# Patient Record
Sex: Female | Born: 1980 | Race: Black or African American | Hispanic: No | Marital: Single | Smoking: Current every day smoker
Health system: Southern US, Community
[De-identification: ages and names within clinical notes are randomized; demographics above are authoritative.]

## PROBLEM LIST (undated history)

## (undated) DIAGNOSIS — F319 Bipolar disorder, unspecified: Secondary | ICD-10-CM

---

## 1999-03-18 ENCOUNTER — Emergency Department (HOSPITAL_COMMUNITY): Admission: EM | Admit: 1999-03-18 | Discharge: 1999-03-18 | Payer: Self-pay | Admitting: Emergency Medicine

## 2011-11-30 ENCOUNTER — Emergency Department: Payer: Self-pay | Admitting: Emergency Medicine

## 2012-10-11 ENCOUNTER — Ambulatory Visit: Payer: Self-pay | Admitting: Advanced Practice Midwife

## 2013-01-21 ENCOUNTER — Encounter: Payer: Self-pay | Admitting: Obstetrics and Gynecology

## 2013-01-24 ENCOUNTER — Encounter: Payer: Self-pay | Admitting: Obstetrics and Gynecology

## 2013-01-28 ENCOUNTER — Encounter: Payer: Self-pay | Admitting: Maternal & Fetal Medicine

## 2013-01-29 ENCOUNTER — Encounter: Payer: Self-pay | Admitting: Pediatrics

## 2013-01-31 ENCOUNTER — Observation Stay: Payer: Self-pay

## 2013-02-04 ENCOUNTER — Encounter: Payer: Self-pay | Admitting: Maternal and Fetal Medicine

## 2013-02-11 ENCOUNTER — Encounter: Payer: Self-pay | Admitting: Obstetrics and Gynecology

## 2013-02-14 ENCOUNTER — Encounter: Payer: Self-pay | Admitting: Obstetrics & Gynecology

## 2013-02-18 ENCOUNTER — Encounter: Payer: Self-pay | Admitting: Maternal and Fetal Medicine

## 2013-02-21 ENCOUNTER — Encounter: Payer: Self-pay | Admitting: Obstetrics and Gynecology

## 2013-02-28 ENCOUNTER — Ambulatory Visit: Payer: Self-pay | Admitting: Obstetrics and Gynecology

## 2013-02-28 ENCOUNTER — Encounter: Payer: Self-pay | Admitting: Obstetrics and Gynecology

## 2013-03-04 ENCOUNTER — Encounter: Payer: Self-pay | Admitting: Obstetrics and Gynecology

## 2013-03-05 ENCOUNTER — Ambulatory Visit: Payer: Self-pay | Admitting: Obstetrics and Gynecology

## 2013-03-06 ENCOUNTER — Ambulatory Visit: Payer: Self-pay | Admitting: Obstetrics and Gynecology

## 2013-03-08 ENCOUNTER — Observation Stay: Payer: Self-pay

## 2013-03-08 LAB — CBC WITH DIFFERENTIAL/PLATELET
Basophil #: 0 10*3/uL (ref 0.0–0.1)
Basophil %: 0.2 %
Eosinophil #: 0 10*3/uL (ref 0.0–0.7)
Eosinophil %: 0.1 %
HCT: 36.1 % (ref 35.0–47.0)
Lymphocyte #: 2.9 10*3/uL (ref 1.0–3.6)
Lymphocyte %: 21.8 %
MCHC: 33.5 g/dL (ref 32.0–36.0)
Monocyte %: 10.9 %
Neutrophil #: 8.8 10*3/uL — ABNORMAL HIGH (ref 1.4–6.5)
Neutrophil %: 67 %
RBC: 3.96 10*6/uL (ref 3.80–5.20)

## 2013-03-08 LAB — BASIC METABOLIC PANEL
Anion Gap: 8 (ref 7–16)
BUN: 16 mg/dL (ref 7–18)
Calcium, Total: 8.9 mg/dL (ref 8.5–10.1)
Chloride: 103 mmol/L (ref 98–107)
Co2: 22 mmol/L (ref 21–32)
Creatinine: 0.95 mg/dL (ref 0.60–1.30)
Glucose: 217 mg/dL — ABNORMAL HIGH (ref 65–99)
Osmolality: 274 (ref 275–301)
Sodium: 133 mmol/L — ABNORMAL LOW (ref 136–145)

## 2013-03-09 LAB — COMPREHENSIVE METABOLIC PANEL
Albumin: 2.6 g/dL — ABNORMAL LOW (ref 3.4–5.0)
Alkaline Phosphatase: 154 U/L — ABNORMAL HIGH (ref 50–136)
Anion Gap: 7 (ref 7–16)
BUN: 14 mg/dL (ref 7–18)
Calcium, Total: 8.8 mg/dL (ref 8.5–10.1)
Co2: 21 mmol/L (ref 21–32)
Creatinine: 0.9 mg/dL (ref 0.60–1.30)
EGFR (African American): >60
EGFR (Non-African Amer.): >60
Glucose: 149 mg/dL — ABNORMAL HIGH (ref 65–99)
Potassium: 3.8 mmol/L (ref 3.5–5.1)
SGPT (ALT): 18 U/L (ref 12–78)
Sodium: 131 mmol/L — ABNORMAL LOW (ref 136–145)
Total Protein: 7.6 g/dL (ref 6.4–8.2)

## 2013-03-09 LAB — RAPID URINE DRUG SCREEN, HOSP PERFORMED
Amphetamines, Ur Screen: NEGATIVE (ref ?–1000)
Barbiturates, Ur Screen: NEGATIVE (ref ?–200)
Cannabinoid 50 Ng, Ur ~~LOC~~: NEGATIVE (ref ?–50)
Opiate, Ur Screen: NEGATIVE (ref ?–300)

## 2013-03-09 LAB — HEPATIC FUNCTION PANEL A (ARMC): Bilirubin, Direct: <0.1 mg/dL (ref 0.00–0.20)

## 2013-03-09 LAB — RENAL FUNCTION PANEL: Phosphorus: 2.8 mg/dL (ref 2.5–4.9)

## 2013-03-10 LAB — COMPREHENSIVE METABOLIC PANEL
Chloride: 108 mmol/L — ABNORMAL HIGH (ref 98–107)
EGFR (African American): >60
EGFR (Non-African Amer.): >60
Osmolality: 268 (ref 275–301)
Potassium: 3.9 mmol/L (ref 3.5–5.1)
Sodium: 134 mmol/L — ABNORMAL LOW (ref 136–145)

## 2013-03-10 LAB — HEPATIC FUNCTION PANEL A (ARMC)
Albumin: 2.3 g/dL — ABNORMAL LOW (ref 3.4–5.0)
Alkaline Phosphatase: 136 U/L (ref 50–136)
Bilirubin, Direct: <0.1 mg/dL (ref 0.00–0.20)
Bilirubin,Total: 0.2 mg/dL (ref 0.2–1.0)
SGOT(AST): 16 U/L (ref 15–37)
SGPT (ALT): 16 U/L (ref 12–78)
Total Protein: 6.7 g/dL (ref 6.4–8.2)

## 2013-03-18 ENCOUNTER — Inpatient Hospital Stay: Payer: Self-pay | Admitting: Obstetrics and Gynecology

## 2013-03-18 ENCOUNTER — Encounter: Payer: Self-pay | Admitting: Maternal and Fetal Medicine

## 2013-03-18 LAB — CBC WITH DIFFERENTIAL/PLATELET
Basophil %: 0.3 %
Eosinophil #: 0 10*3/uL (ref 0.0–0.7)
Eosinophil %: 0.2 %
HCT: 37.2 % (ref 35.0–47.0)
HGB: 12.7 g/dL (ref 12.0–16.0)
Lymphocyte %: 22.9 %
MCH: 31.2 pg (ref 26.0–34.0)
MCHC: 34.2 g/dL (ref 32.0–36.0)
Monocyte #: 0.9 x10 3/mm (ref 0.2–0.9)
Monocyte %: 8.5 %
Neutrophil %: 68.1 %
RBC: 4.06 10*6/uL (ref 3.80–5.20)
RDW: 15.3 % — ABNORMAL HIGH (ref 11.5–14.5)
WBC: 10 10*3/uL (ref 3.6–11.0)

## 2013-03-20 LAB — PATHOLOGY REPORT

## 2014-08-22 IMAGING — US US OB FOLLOW-UP
1 series · 14 of 28 positions shown · non-contrast
Comparison: none

[Series 1: us ob follow-up · 0.25mm/px · 14 of 63 slices shown]
[im 3/63]
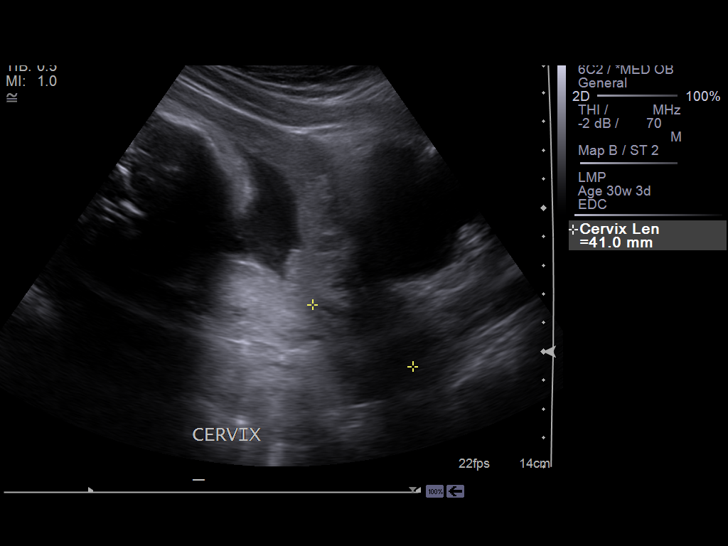
[im 7/63]
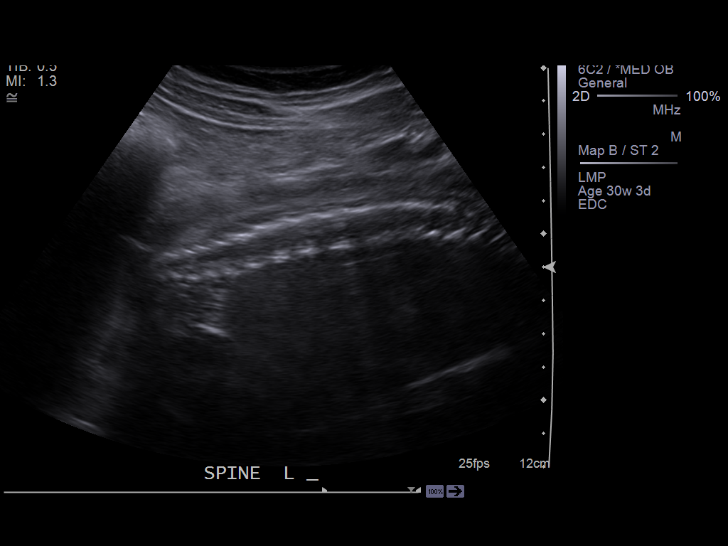
[im 12/63]
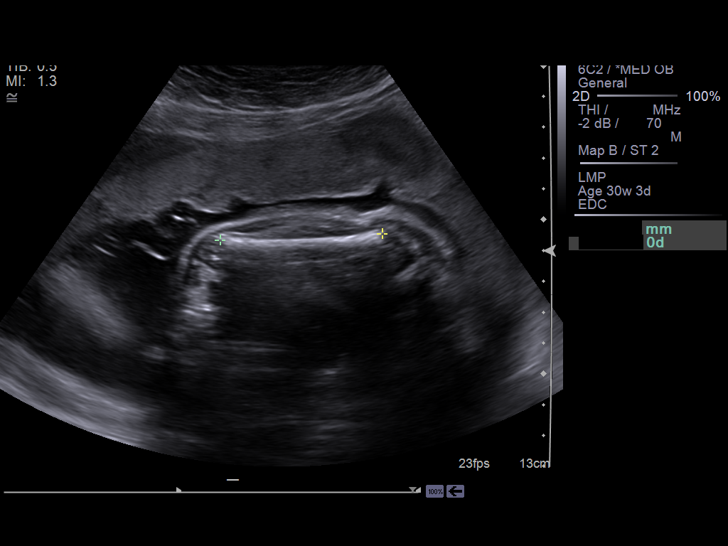
[im 17/63]
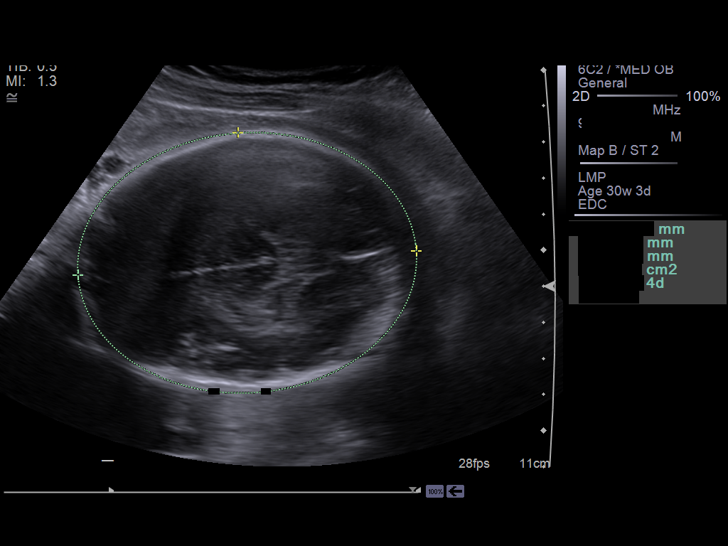
[im 21/63]
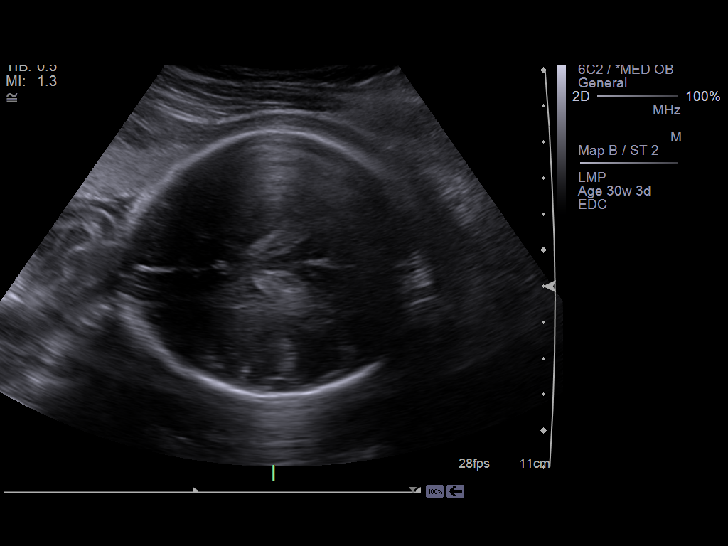
[im 26/63]
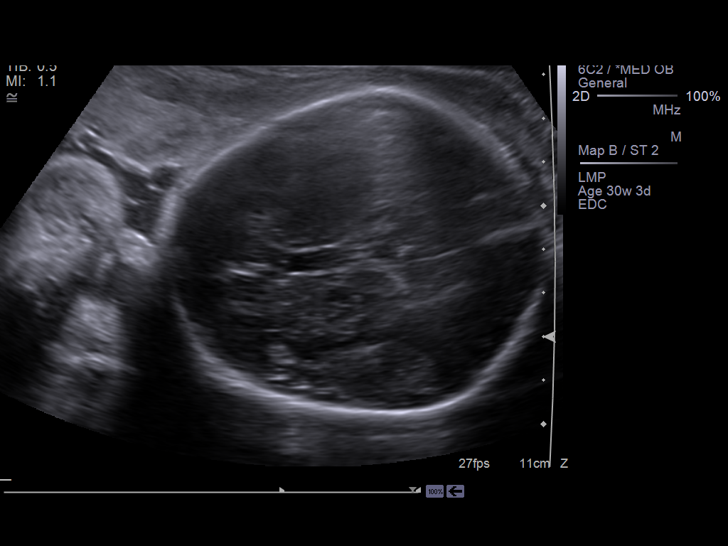
[im 30/63]
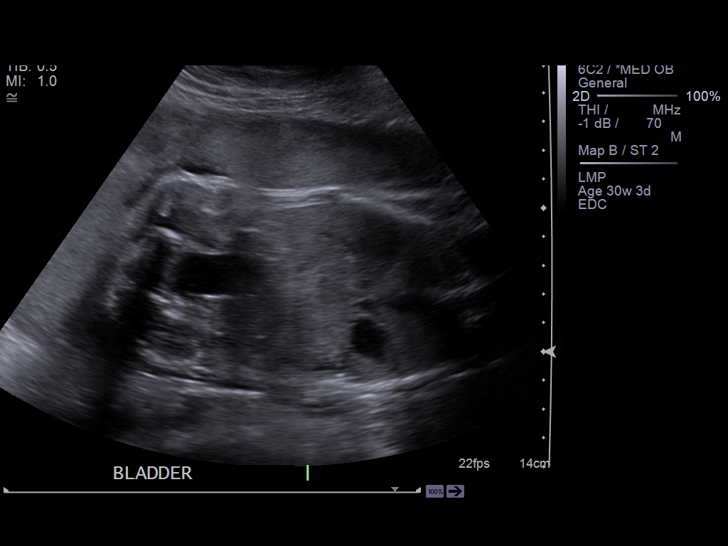
[im 35/63]
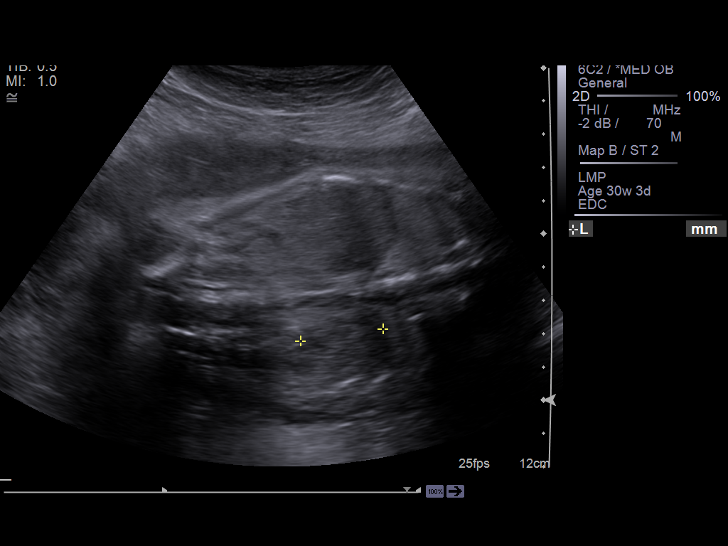
[im 40/63]
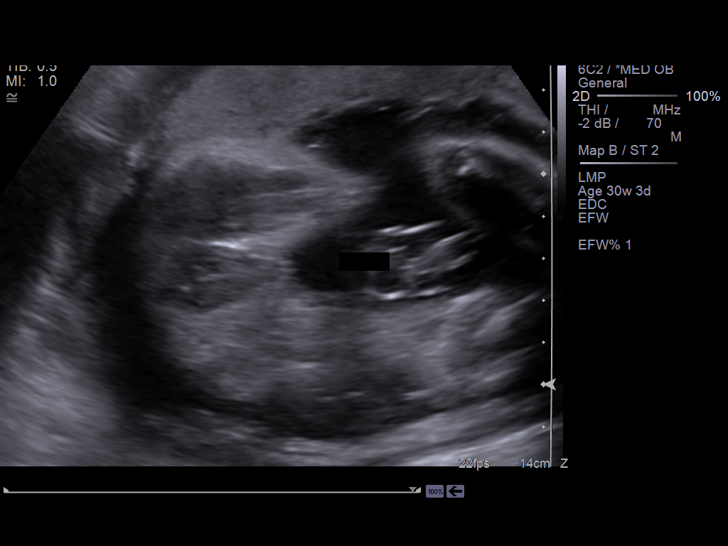
[im 44/63]
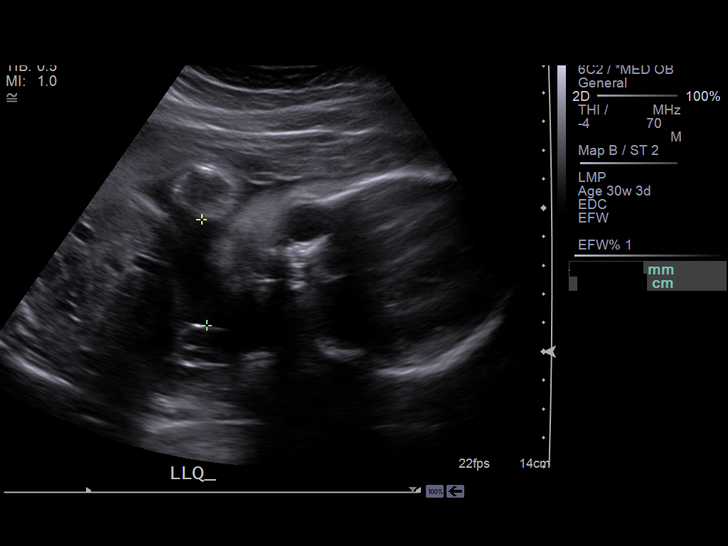
[im 49/63]
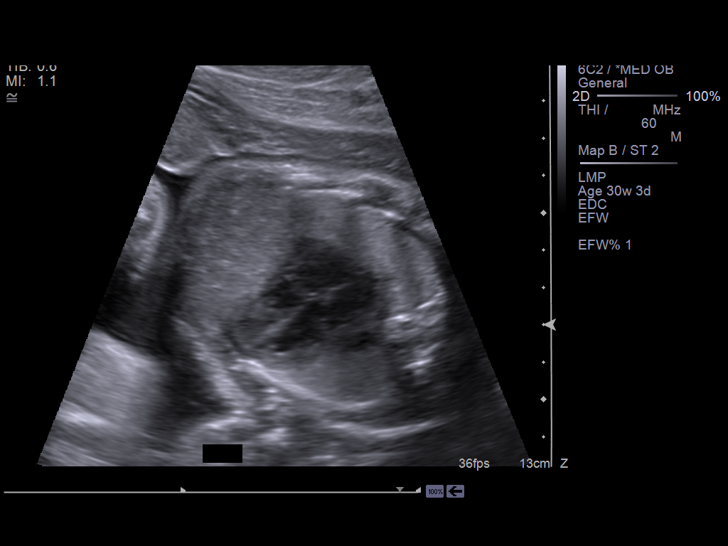
[im 53/63]
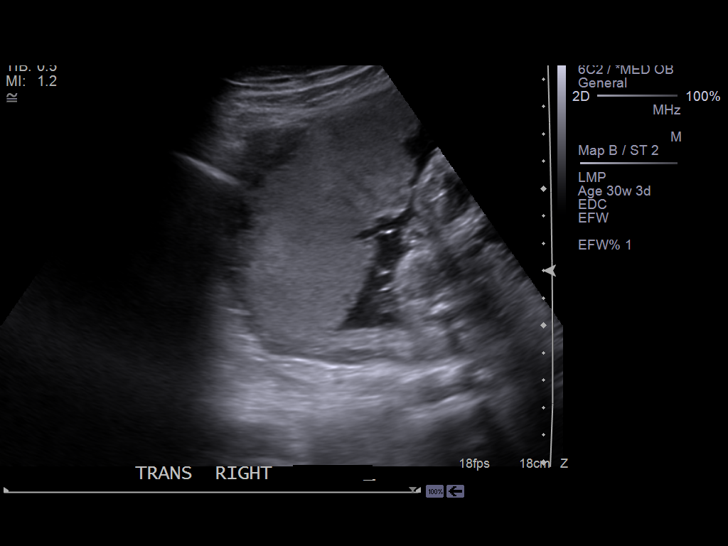
[im 58/63]
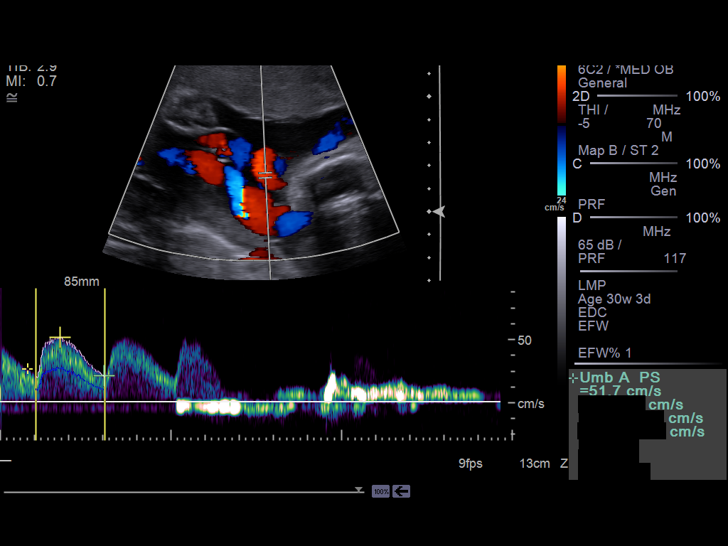
[im 63/63]
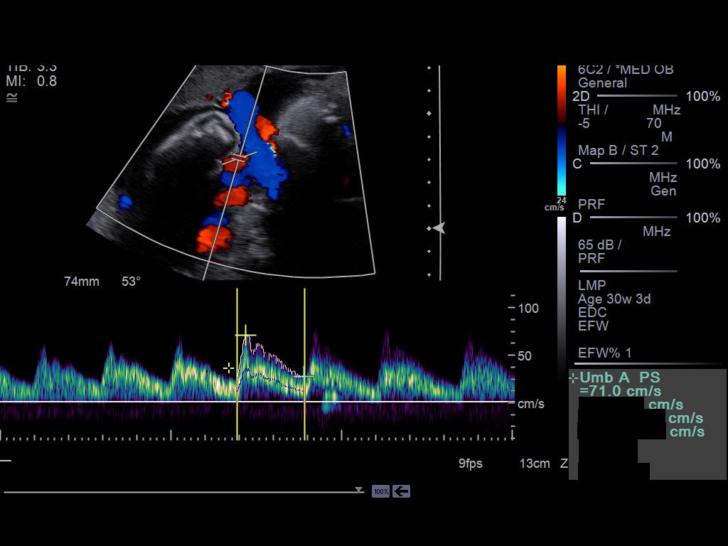

[14 of 28 positions shown; findings below may reference images not displayed]

IMAGES IMPORTED FROM THE [employer] WORKFLOW SYSTEM
NO DICTATION FOR STUDY

## 2014-09-26 IMAGING — US ULTRAOUND OB LIMITED - NRPT [facility]
1 series · 14 of 16 positions shown · non-contrast
Comparison: none

[Series 1: ultraound ob limited - nrpt [facility] · 0.28mm/px · 14 of 16 slices shown]
[im 1/16]
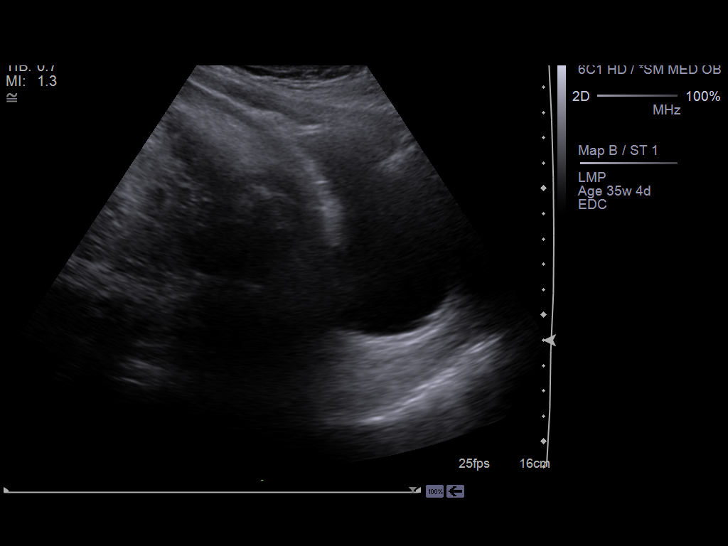
[im 2/16]
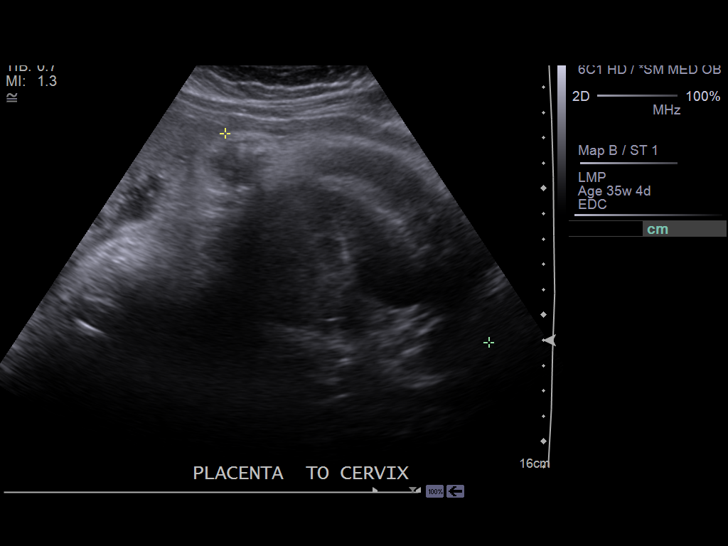
[im 3/16]
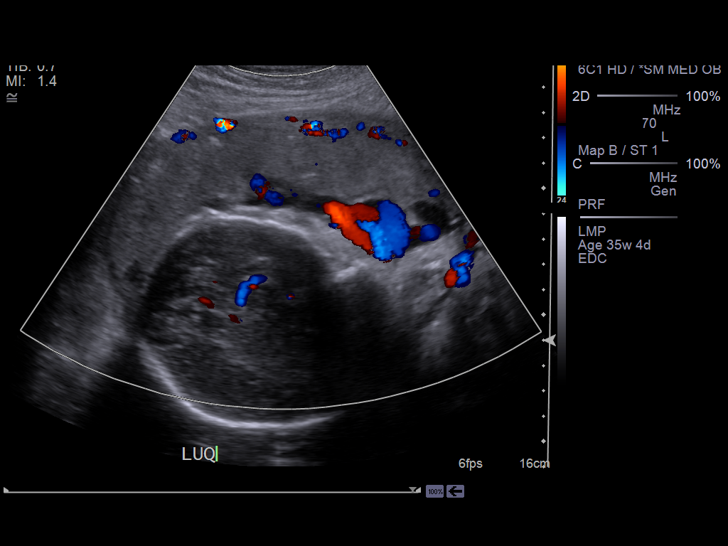
[im 5/16]
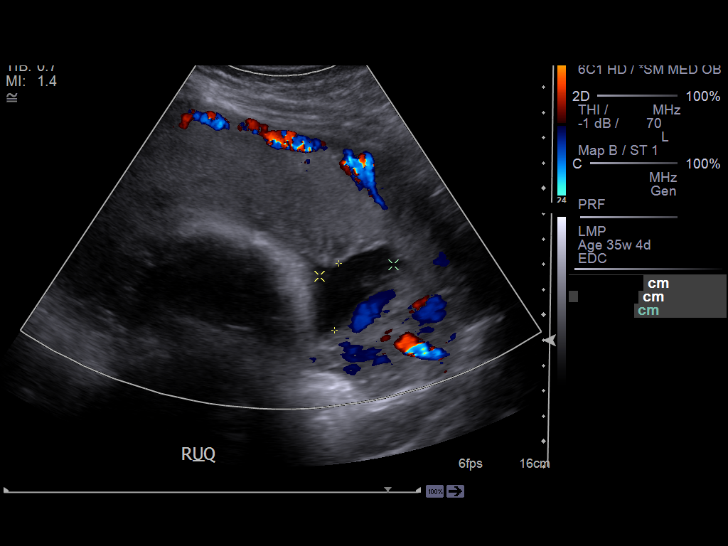
[im 6/16]
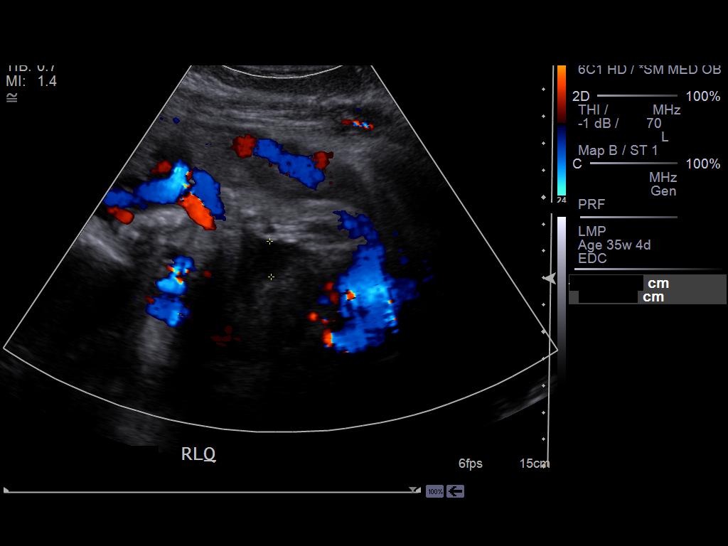
[im 7/16]
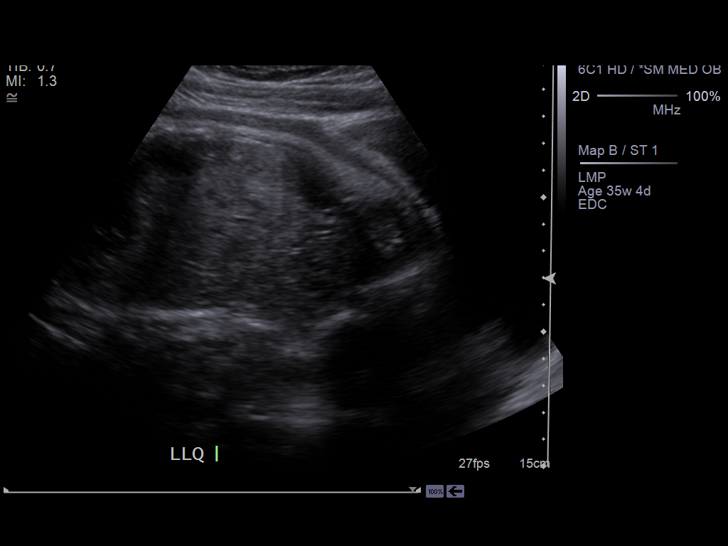
[im 8/16]
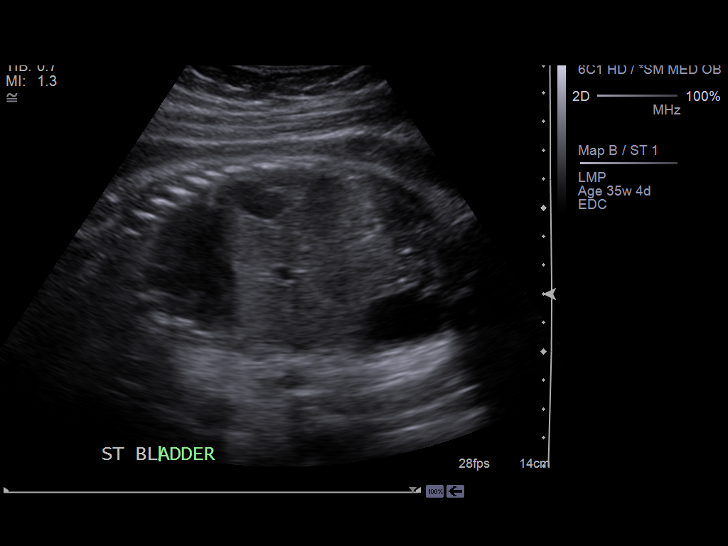
[im 9/16]
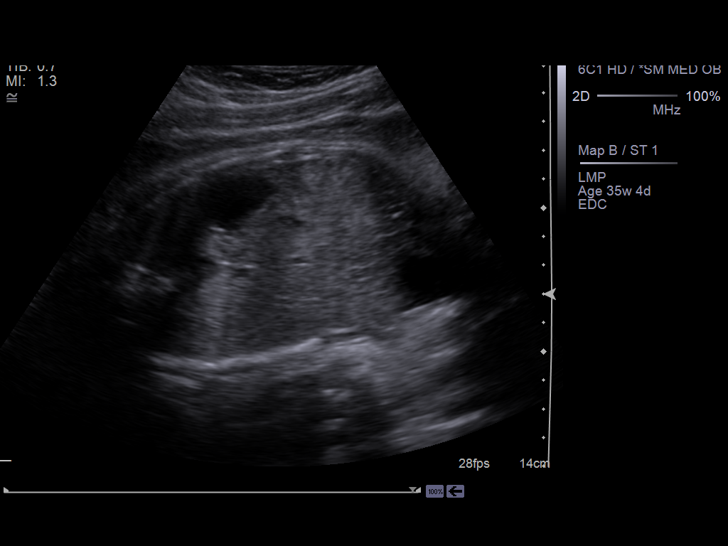
[im 10/16]
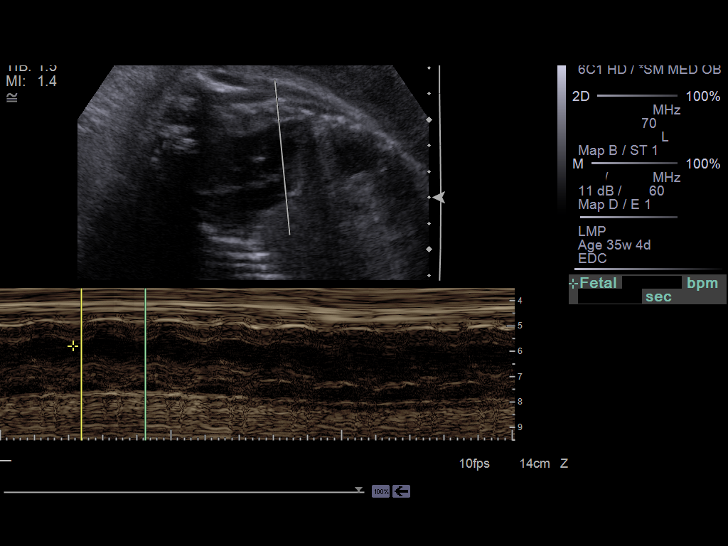
[im 11/16]
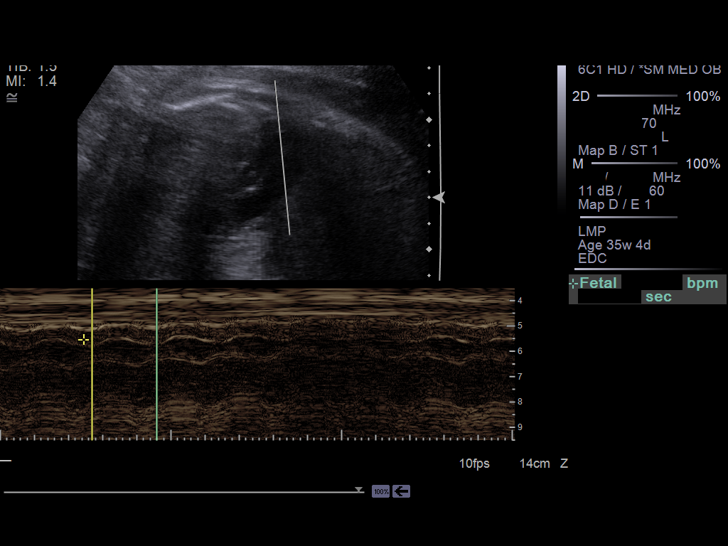
[im 13/16]
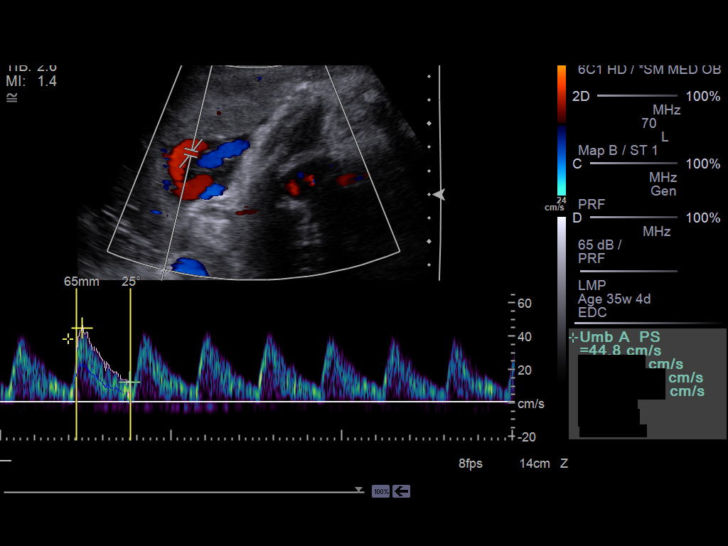
[im 14/16]
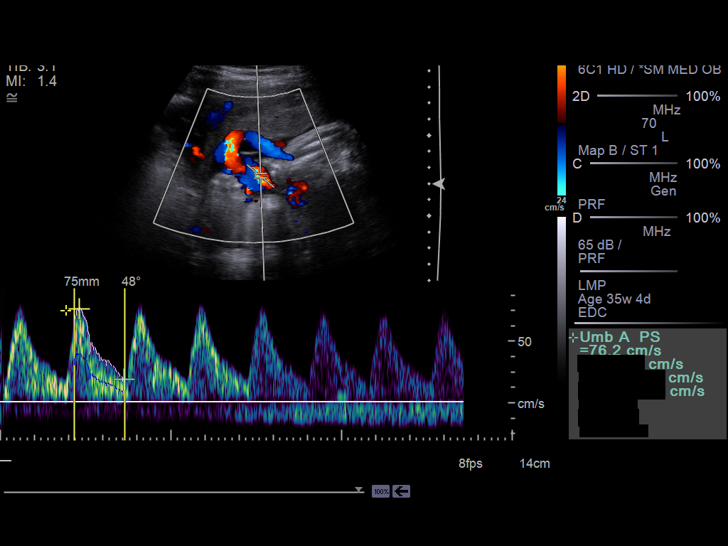
[im 15/16]
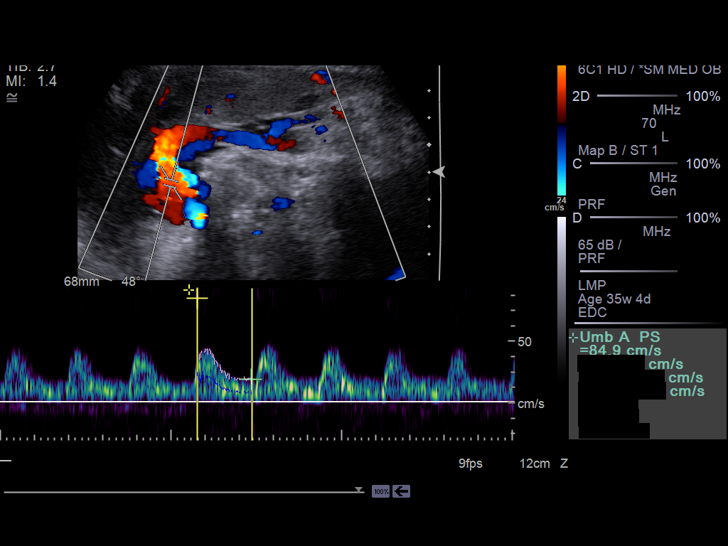
[im 16/16]
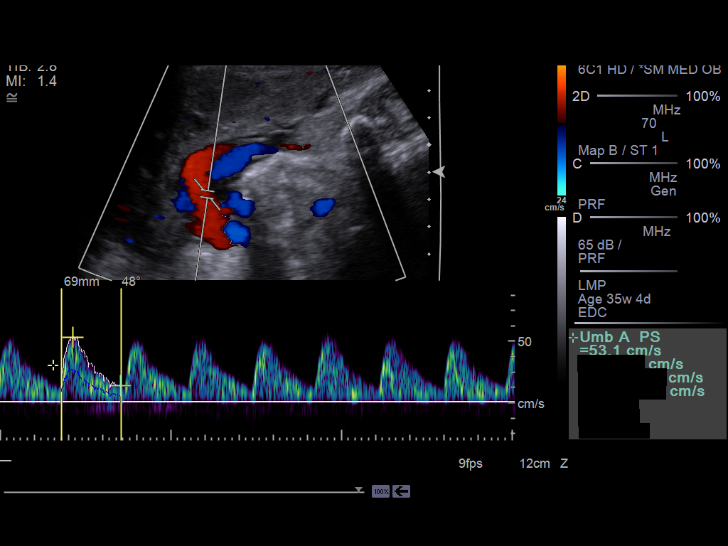

[14 of 16 positions shown; findings below may reference images not displayed]

IMAGES IMPORTED FROM THE SYNGO WORKFLOW SYSTEM
NO DICTATION FOR STUDY

## 2015-04-13 ENCOUNTER — Ambulatory Visit
Admit: 2015-04-13 | Disposition: A | Discharge status: Home / Self Care | Payer: Self-pay | Attending: Family Medicine | Admitting: Family Medicine

## 2015-04-17 NOTE — Op Note (Signed)
PATIENT NAME:  Marcia Jensen, Marcia Jensen MR#:  [id number] DATE OF BIRTH:  [birth year]  DATE OF PROCEDURE:  03/18/2013  PREOPERATIVE DIAGNOSES:  1.  Thirty-five plus week gestation.  2.  Intrauterine growth retardation.  3.  Oligohydramnios.  4.  Worsening S/D ratio by Doppler of the umbilical cord.  5.  Nonreassuring fetal tracing.   POSTOPERATIVE DIAGNOSES: 1.  Thirty-five plus week gestation.  2.  Intrauterine growth retardation.  3.  Oligohydramnios.  4.  Worsening S/D ratio by Doppler of the umbilical cord.  5.  Nonreassuring fetal tracing.   PROCEDURE: Primary low transverse cesarean section.   SURGEON: Ricky L. Logan BoresEvans, MD   ASSISTANT: For scrub Morrie Sheldon([place])   ANESTHESIA: Spinal.   FINDINGS: Grossly normal uterus, small infant, weight pending. Apgars 7 and 9. Grossly normal placenta. Good hemostasis, cosmesis.   ESTIMATED BLOOD LOSS: 750.   COMPLICATIONS: None.   DRAINS: Foley.   SPECIMENS: Cord blood for gas; placenta sent for pathology.   PROCEDURE IN DETAIL: I have been following the patient for the past few weeks due to intrauterine growth restriction and low fluid. [facility]. She was evaluated at MFM today and was found to have a Doppler of the cord of 3.8, which is borderline abnormal. Fluid was also 4 cm, qualifying as oligohydramnios. We recommended delivery. The patient was presented to labor and delivery where fetal monitoring showed recurrent moderate variable decelerations, in the absence of contractions,   I discussed with the patient my concerns, especially given the fact that she has never had a baby before and she is 35 weeks; typically it takes 1 to 2 days to induce labor and if the infant was having moderate decelerations at rest, I was concerned about the stability to withstand a prolonged labor. I recommend a cesarean section, discussed with patient and family, and they stated understanding and consented. Consent was  signed.   The patient was taken to the operating room where a spinal was placed. She was placed in the supine position. Foley catheter was inserted. She was prepped and draped in the usual sterile  fashion.  After assuring adequate anesthesia, a #10 blade was used to create a transverse incision, and a primary low transverse cesarean section was carried out in the usual fashion. The infant was in frank breech presentation, was delivered thusly. The uterine cavity was curettaged and was closed with a running interlocking 0 chromic. Figure-of-eights were placed approximately one-third of the way across in the left and at the right apex for hemostasis. The area seemed to be hemostatic. The uterus was returned to the abdominal cavity, copiously irrigated. The areas again seemed to be hemostatic. The rectus muscle was checked for hemostasis and seen to be excellent. Fascia was closed left-to-right with 0 Vicryl, the subQ was made hemostatic with cautery,  and the skin was closed with Insorb absorbable clips. A sterile dressing was applied.   Two grams of Ancef were given IV preoperatively. The patient tolerated the procedure well. I anticipate a routine postoperative course.    ____________________________ Reatha Harpsicky L. Logan BoresEvans, MD rle:dm D: 03/18/2013 15:11:00 ET T: 03/18/2013 15:47:49 ET JOB#: [id number]  cc: Ricky L. Logan BoresEvans, MD, <Dictator> Augustina MoodICK L Lynsey Ange MD ELECTRONICALLY SIGNED 03/19/2013 8:52

## 2015-04-17 NOTE — Discharge Summary (Signed)
PATIENT NAME:  Marcia Jensen, Cadie N MR#:  [id number] DATE OF BIRTH:  March 21, 1981  DATE OF ADMISSION:  03/18/2013 DATE OF DISCHARGE:  03/21/2013  ADMITTING DIAGNOSES: Intrauterine fetal demise at 27 weeks with presumed placental abruption.   DISCHARGE DIAGNOSES: Intrauterine fetal demise at 27 weeks with presumed placental abruption.   PROCEDURE: Low vertical hysterotomy.   CONSULTATIONS: Anesthesia and Neonatology. Please see operative note.   HOSPITAL COURSE: Postpartum was uncomplicated. She was afebrile throughout. Mental status appeared appropriate, appeared to be grieving well. She was discharged home with routine prescriptions, precautions, and followup.  Will discuss with her at followup my recommendation to not have more children due to operative difficulty and poor obstetrical history.     ____________________________ Reatha Harpsicky L. Logan BoresEvans, MD rle:es D: 03/25/2013 12:12:02 ET T: 03/25/2013 12:50:08 ET JOB#: [id number]  cc: Ricky L. Logan BoresEvans, MD, <Dictator>

## 2015-04-17 NOTE — Discharge Summary (Signed)
PATIENT NAME:  Marcia Jensen, Loza N MR#:  [id number] DATE OF BIRTH:  12-18-1981  DATE OF ADMISSION:  03/18/2013 DATE OF DISCHARGE:  03/21/2013  ADMISSION DIAGNOSIS:  1. A 35+ week gestation. 2. Intrauterine growth retardation. 3. Oligohydramnios.  4. Worsening S/D ratio by Doppler of the umbilical cord.  5. Nonreassuring fetal tracing.   DISCHARGE DIAGNOSES: 1. A 35+ week gestation. 2. Intrauterine growth retardation. 3. Oligohydramnios.  4. Worsening S/D ratio by Doppler of the umbilical cord.  5. Nonreassuring fetal tracing.   PROCEDURE: Primary low transverse cesarean section. Please see operative note.  HOSPITAL COURSE: Postoperatively, the patient did well. Hospital course was uncomplicated, and she was discharged home with routine prescriptions, precautions, and followup.    ____________________________ Reatha Harpsicky L. Logan BoresEvans, MD rle:es D: 03/25/2013 12:13:00 ET T: 03/25/2013 12:52:58 ET JOB#: [id number]  cc: Ricky L. Logan BoresEvans, MD, <Dictator> Augustina MoodICK L Saul Dorsi MD ELECTRONICALLY SIGNED 03/26/2013 9:55

## 2015-05-05 NOTE — H&P (Signed)
L&D Evaluation:  History:  HPI 34 y/o G1 with EDC 04/23/13 (U/S; unk LNP) Pegnancy complicated ny IUGR, Oligohydramnios and now rising S:D by doppler Sent for induction   Presents with for induction   Patient's Medical History No Chronic Illness   Patient's Surgical History none   Medications Pre Natal Vitamins   Allergies NKDA   Social History none   Family History Non-Contributory   ROS:  ROS All systems were reviewed.  HEENT, CNS, GI, GU, Respiratory, CV, Renal and Musculoskeletal systems were found to be normal.   Exam:  Vital Signs stable   Mental Status clear   Chest clear   Heart normal sinus rhythm   Abdomen gravid, non-tender   Estimated Fetal Weight Small for gestational age   Reflexes 1+   Mebranes Intact   FHT recurrent variable decels   FHT Description Variable decelerations   Ucx absent   Impression:  Impression IUGR, Oligo, rising S:D on doppler   Plan:  Comments Planned induction, but recurrent mod variable decels on NST makes fetral tolerance of (likely) prolonged induction unlikely Discussed situation with pt and family; I recommend urgent C/S After discussion of C/S with pt (technique, recovery, reasoning for procedure, risks of bleed/organ injury/repeat surgery/extension of surgery/...) pt gives consent Pt declines BTL Neonatology aware   Electronic Signatures: Margaretha GlassingEvans, Ricky L (MD)  (Signed 24-Mar-14 14:06)  Authored: L&D Evaluation   Last Updated: 24-Mar-14 14:06 by Margaretha GlassingEvans, Ricky L (MD)

## 2015-05-05 NOTE — H&P (Signed)
L&D Evaluation:  History:  HPI 34 yo G1P0 with lMP of 04/06/13 & EDD of 04/18/13 with [facility] as pt had a drop in her AFI from 14 to 8.41 in 1 week, single umbilical artery, IUGR and fundal ht of 28 cm at 34 weeks. NST reactive in office with 2 accels 15 x 15 bpm. Pt has no job, no car (repossed), no family who she can depend on to bring her to the office for NST's. Pt already failed to fu for BMZ 2nd dose although she got it much later. No ROM,VB,UC's or decreased FM. Pt also lost 2 pounds in the last 2 weeks. Poor historian. Pt told RN that her mom had a car but, told me she did not. On questioning pt, she advised myself and RN that her mom does not have car.   Presents with ?LOF   Patient's Medical History Schizo,Bi-polar, Enlarged thyroid, Hx MJ,Crack,   Medications Iron   Allergies NKDA   Social History drugs  used MJ,crack but UDS neg   Family History Non-Contributory   ROS:  ROS All systems were reviewed.  HEENT, CNS, GI, GU, Respiratory, CV, Renal and Musculoskeletal systems were found to be normal.   Exam:  Vital Signs stable   General no apparent distress   Mental Status clear   Chest clear   Heart normal sinus rhythm, no murmur/gallop/rubs   Abdomen gravid, non-tender   Estimated Fetal Weight Small for gestational age   Fetal Position Breech   Back no CVAT   Reflexes 1+   Pelvic not evaluated   Mebranes Intact   FHT normal rate with no decels   Ucx absent   Skin dry   Lymph no lymphadenopathy   Plan:  Plan EFM/NST, Will dc pt if a ride can be set up for Monday NST   Comments Pt ate 2 meals since arrival.   Electronic Signatures: Sharee PimpleJones, Caron W (CNM)  (Signed 14-Mar-14 13:41)  Authored: L&D Evaluation   Last Updated: 14-Mar-14 13:41 by Sharee PimpleJones, Caron W (CNM)

## 2015-12-15 ENCOUNTER — Ambulatory Visit
Admission: EM | Admit: 2015-12-15 | Discharge: 2015-12-15 | Disposition: A | Discharge status: Home / Self Care | Payer: PRIVATE HEALTH INSURANCE | Attending: Family Medicine | Admitting: Family Medicine

## 2015-12-15 ENCOUNTER — Encounter: Payer: Self-pay | Admitting: Emergency Medicine

## 2015-12-15 DIAGNOSIS — B9789 Other viral agents as the cause of diseases classified elsewhere: Principal | ICD-10-CM

## 2015-12-15 DIAGNOSIS — J069 Acute upper respiratory infection, unspecified: Secondary | ICD-10-CM | POA: Diagnosis not present

## 2015-12-15 MED ORDER — ALBUTEROL SULFATE HFA 108 (90 BASE) MCG/ACT IN AERS: 1.0000 | INHALATION_SPRAY | Freq: Four times a day (QID) | RESPIRATORY_TRACT | Status: DC | PRN

## 2015-12-15 MED ORDER — ALBUTEROL SULFATE (2.5 MG/3ML) 0.083% IN NEBU
2.5000 mg | INHALATION_SOLUTION | Freq: Once | RESPIRATORY_TRACT | Status: AC
  Administered 2015-12-15: 2.5 mg via RESPIRATORY_TRACT

## 2015-12-15 MED ORDER — AMOXICILLIN-POT CLAVULANATE 875-125 MG PO TABS: 1.0000 | ORAL_TABLET | Freq: Two times a day (BID) | ORAL | Status: DC

## 2015-12-15 NOTE — ED Provider Notes (Signed)
CSN: [id number]     Arrival date & time 12/15/15  1242 History   First MD Initiated Contact with Patient 12/15/15 1409    Nurses notes were reviewed. Chief Complaint  Patient presents with  . Cough   Patient is here because of a cough and congestion.  (Consider location/radiation/quality/duration/timing/severity/associated sxs/prior Treatment) HPI  History reviewed. No pertinent past medical history. History reviewed. No pertinent past surgical history. History reviewed. No pertinent family history. Social History  Substance Use Topics  . Smoking status: Current Every Day Smoker -- 0.50 packs/day    Types: Cigarettes  . Smokeless tobacco: None  . Alcohol Use: Yes   OB History    No data available     Review of Systems  All other systems reviewed and are negative.   Allergies  Review of patient's allergies indicates no known allergies.  Home Medications   Prior to Admission medications   Medication Sig Start Date End Date Taking? Authorizing Provider  albuterol (PROVENTIL HFA;VENTOLIN HFA) 108 (90 BASE) MCG/ACT inhaler Inhale 1-2 puffs into the lungs every 6 (six) hours as needed for wheezing or shortness of breath. 12/15/15   Erasmo DownerAngela M Bacigalupo, MD  amoxicillin-clavulanate (AUGMENTIN) 875-125 MG tablet Take 1 tablet by mouth every 12 (twelve) hours. 12/18/15   Erasmo DownerAngela M Bacigalupo, MD   Meds Ordered and Administered this Visit   Medications  albuterol (PROVENTIL) (2.5 MG/3ML) 0.083% nebulizer solution 2.5 mg (2.5 mg Nebulization Given 12/15/15 1435)    BP 132/75 mmHg  Pulse 89  Temp(Src) 98.3 F (36.8 C) (Tympanic)  Resp 18  Ht 5\' 6"  (1.676 m)  Wt 197 lb (89.359 kg)  BMI 31.81 kg/m2  SpO2 99%  LMP 11/27/2015 (Exact Date) No data found.   Physical Exam  Constitutional: She is oriented to person, place, and time. She appears well-developed and well-nourished.  HENT:  Head: Normocephalic.  Eyes: Pupils are equal, round, and reactive to light.   Cardiovascular: Normal rate and regular rhythm.   Pulmonary/Chest: Effort normal. She has wheezes.  Neurological: She is alert and oriented to person, place, and time.  Skin: Skin is warm.  Psychiatric: She has a normal mood and affect.  Vitals reviewed.   ED Course  Procedures (including critical care time)  Labs Review Labs Reviewed - No data to display  Imaging Review No results found.   Visual Acuity Review  Right Eye Distance:   Left Eye Distance:   Bilateral Distance:    Right Eye Near:   Left Eye Near:    Bilateral Near:         MDM   1. Viral URI with cough    Seen with Dr. Leonard SchwartzB. Patient was evaluated after breathing treatment appears to be breathing and passing air much better with only occasional wheezing present. Continue current care outlined by Dr. Ramonita LabB     Jaclynn Laumann, MD 12/15/15 2109

## 2015-12-15 NOTE — Discharge Instructions (Signed)
If you are not better by 12/23, fill prescription for Augmentin and finish entire course of antibiotics to treat for bronchitis.   Upper Respiratory Infection, Adult Most upper respiratory infections (URIs) are a viral infection of the air passages leading to the lungs. A URI affects the nose, throat, and upper air passages. The most common type of URI is nasopharyngitis and is typically referred to as "the common cold." URIs run their course and usually go away on their own. Most of the time, a URI does not require medical attention, but sometimes a bacterial infection in the upper airways can follow a viral infection. This is called a secondary infection. Sinus and middle ear infections are common types of secondary upper respiratory infections. Bacterial pneumonia can also complicate a URI. A URI can worsen asthma and chronic obstructive pulmonary disease (COPD). Sometimes, these complications can require emergency medical care and may be life threatening.  CAUSES Almost all URIs are caused by viruses. A virus is a type of germ and can spread from one person to another.  RISKS FACTORS You may be at risk for a URI if:   You smoke.   You have chronic heart or lung disease.  You have a weakened defense (immune) system.   You are very young or very old.   You have nasal allergies or asthma.  You work in crowded or poorly ventilated areas.  You work in health care facilities or schools. SIGNS AND SYMPTOMS  Symptoms typically develop 2-3 days after you come in contact with a cold virus. Most viral URIs last 7-10 days. However, viral URIs from the influenza virus (flu virus) can last 14-18 days and are typically more severe. Symptoms may include:   Runny or stuffy (congested) nose.   Sneezing.   Cough.   Sore throat.   Headache.   Fatigue.   Fever.   Loss of appetite.   Pain in your forehead, behind your eyes, and over your cheekbones (sinus pain).  Muscle aches.   DIAGNOSIS  Your health care provider may diagnose a URI by:  Physical exam.  Tests to check that your symptoms are not due to another condition such as:  Strep throat.  Sinusitis.  Pneumonia.  Asthma. TREATMENT  A URI goes away on its own with time. It cannot be cured with medicines, but medicines may be prescribed or recommended to relieve symptoms. Medicines may help:  Reduce your fever.  Reduce your cough.  Relieve nasal congestion. HOME CARE INSTRUCTIONS   Take medicines only as directed by your health care provider.   Gargle warm saltwater or take cough drops to comfort your throat as directed by your health care provider.  Use a warm mist humidifier or inhale steam from a shower to increase air moisture. This may make it easier to breathe.  Drink enough fluid to keep your urine clear or pale yellow.   Eat soups and other clear broths and maintain good nutrition.   Rest as needed.   Return to work when your temperature has returned to normal or as your health care provider advises. You may need to stay home longer to avoid infecting others. You can also use a face mask and careful hand washing to prevent spread of the virus.  Increase the usage of your inhaler if you have asthma.   Do not use any tobacco products, including cigarettes, chewing tobacco, or electronic cigarettes. If you need help quitting, ask your health care provider. PREVENTION  The best way  to protect yourself from getting a cold is to practice good hygiene.   Avoid oral or hand contact with people with cold symptoms.   Wash your hands often if contact occurs.  There is no clear evidence that vitamin C, vitamin E, echinacea, or exercise reduces the chance of developing a cold. However, it is always recommended to get plenty of rest, exercise, and practice good nutrition.  SEEK MEDICAL CARE IF:   You are getting worse rather than better.   Your symptoms are not controlled by  medicine.   You have chills.  You have worsening shortness of breath.  You have brown or red mucus.  You have yellow or brown nasal discharge.  You have pain in your face, especially when you bend forward.  You have a fever.  You have swollen neck glands.  You have pain while swallowing.  You have white areas in the back of your throat. SEEK IMMEDIATE MEDICAL CARE IF:   You have severe or persistent:  Headache.  Ear pain.  Sinus pain.  Chest pain.  You have chronic lung disease and any of the following:  Wheezing.  Prolonged cough.  Coughing up blood.  A change in your usual mucus.  You have a stiff neck.  You have changes in your:  Vision.  Hearing.  Thinking.  Mood. MAKE SURE YOU:   Understand these instructions.  Will watch your condition.  Will get help right away if you are not doing well or get worse.   This information is not intended to replace advice given to you by your health care provider. Make sure you discuss any questions you have with your health care provider.   Document Released: 06/07/2001 Document Revised: 04/28/2015 Document Reviewed: 03/19/2014 Elsevier Interactive Patient Education [employer].

## 2015-12-15 NOTE — ED Notes (Signed)
Patient c/o cough and chest congestion since this past weekend.

## 2015-12-15 NOTE — ED Provider Notes (Signed)
CSN: [id number]     Arrival date & time 12/15/15  1242 History   First MD Initiated Contact with Patient 12/15/15 1409     Chief Complaint  Patient presents with  . Cough   (Consider location/radiation/quality/duration/timing/severity/associated sxs/prior Treatment) HPI   Marcia Jensen is a 34 y.o. female presenting for cough and shortness of breath x4-5 days. She thought it was a cold, but was concerned when she developed wheezing and worsening SOB yesterday. Current symptoms include non-productive cough, wheezing, SOB, clear rhinorrhea, chest congestion, sore throat. She is tried taking Alka-Seltzer cold starting yesterday, which has not yet helped her symptoms at all. She correlates initial symptoms of sore throat, cough, wheezing and shortness of breath with taking ibuprofen (though she has taken this before without difficulty). Denies any fever, sputum production, chest pain. She reports she smokes about half a pack a day of cigarettes and things they help her breathe better.  History reviewed. No pertinent past medical history. History reviewed. No pertinent past surgical history. History reviewed. No pertinent family history. Social History  Substance Use Topics  . Smoking status: Current Every Day Smoker -- 0.50 packs/day    Types: Cigarettes  . Smokeless tobacco: None  . Alcohol Use: Yes   OB History    No data available     Review of Systems  Constitutional: Negative for fever, activity change, appetite change and fatigue.  HENT: Positive for congestion, rhinorrhea and sore throat. Negative for ear discharge, ear pain and trouble swallowing.   Eyes: Negative for pain, discharge and redness.  Respiratory: Positive for cough, shortness of breath and wheezing.   Cardiovascular: Negative for chest pain and leg swelling.  All other systems reviewed and are negative.   Allergies  Review of patient's allergies indicates no known allergies.  Home Medications   Prior to  Admission medications   Medication Sig Start Date End Date Taking? Authorizing Provider  albuterol (PROVENTIL HFA;VENTOLIN HFA) 108 (90 BASE) MCG/ACT inhaler Inhale 1-2 puffs into the lungs every 6 (six) hours as needed for wheezing or shortness of breath. 12/15/15   Erasmo DownerAngela M Bacigalupo, MD  amoxicillin-clavulanate (AUGMENTIN) 875-125 MG tablet Take 1 tablet by mouth every 12 (twelve) hours. 12/18/15   Erasmo DownerAngela M Bacigalupo, MD   Meds Ordered and Administered this Visit   Medications  albuterol (PROVENTIL) (2.5 MG/3ML) 0.083% nebulizer solution 2.5 mg (2.5 mg Nebulization Given 12/15/15 1435)    BP 132/75 mmHg  Pulse 89  Temp(Src) 98.3 F (36.8 C) (Tympanic)  Resp 18  Ht 5\' 6"  (1.676 m)  Wt 197 lb (89.359 kg)  BMI 31.81 kg/m2  SpO2 99%  LMP 11/27/2015 (Exact Date) No data found.   Physical Exam  Constitutional: She is oriented to person, place, and time. She appears well-developed and well-nourished. No distress.  HENT:  Head: Normocephalic and atraumatic.  Mouth/Throat: Oropharynx is clear and moist. No oropharyngeal exudate.  TMs clear bilaterally  Eyes: Conjunctivae and EOM are normal. Pupils are equal, round, and reactive to light. No scleral icterus.  Neck: Neck supple. Thyromegaly present.  Cardiovascular: Normal rate, regular rhythm, normal heart sounds and intact distal pulses.   No murmur heard. Pulmonary/Chest: Effort normal. No respiratory distress.  Speaks in full sentences, breathing comfortably without accessory muscle use on RA Diffuse expiratory wheeze  Abdominal: Soft. Bowel sounds are normal.  Musculoskeletal: She exhibits no edema or tenderness.  Lymphadenopathy:    She has no cervical adenopathy.  Neurological: She is alert and oriented to person, place,  and time.  Skin: Skin is warm and dry. No rash noted.     ED Course  Procedures (including critical care time)  Labs Review Labs Reviewed - No data to display  Imaging Review No results  found.   Visual Acuity Review  Right Eye Distance:   Left Eye Distance:   Bilateral Distance:    Right Eye Near:   Left Eye Near:    Bilateral Near:       MDM   1. Viral URI with cough    Symptoms consistent with viral URI.  Discussed natural course, return precautions, and symptomatic management with patient.  Albuterol neb given in Urgent Care with improvement in SOB and wheezing.  Discussed importance of tobacco cessation.  Will Rx albuterol inhaler for wheezing or SOB.  Will give post-dated Rx for Augmentin to treat bronchitis if not improved by Friday 12/23.  Incidentally found thyromegaly on exam.  Discussed with patient that she should find PCP for follow-up.  Erasmo Downer, MD, MPH PGY-2,  [facility] 12/15/2015 2:45 PM    Erasmo Downer, MD 12/15/15 1449  Erasmo Downer, MD 12/15/15 [phone]

## 2016-10-10 ENCOUNTER — Encounter: Payer: Self-pay | Admitting: *Deleted

## 2016-10-10 ENCOUNTER — Ambulatory Visit
Admission: EM | Admit: 2016-10-10 | Discharge: 2016-10-10 | Disposition: A | Discharge status: Home / Self Care | Payer: Medicaid Other | Attending: Family Medicine | Admitting: Family Medicine

## 2016-10-10 ENCOUNTER — Ambulatory Visit: Payer: Medicaid Other

## 2016-10-10 DIAGNOSIS — R05 Cough: Secondary | ICD-10-CM | POA: Insufficient documentation

## 2016-10-10 DIAGNOSIS — R0602 Shortness of breath: Secondary | ICD-10-CM | POA: Insufficient documentation

## 2016-10-10 DIAGNOSIS — J069 Acute upper respiratory infection, unspecified: Secondary | ICD-10-CM

## 2016-10-10 HISTORY — DX: Bipolar disorder, unspecified: F31.9

## 2016-10-10 MED ORDER — IPRATROPIUM-ALBUTEROL 0.5-2.5 (3) MG/3ML IN SOLN
3.0000 mL | Freq: Once | RESPIRATORY_TRACT | Status: AC
  Administered 2016-10-10: 3 mL via RESPIRATORY_TRACT

## 2016-10-10 MED ORDER — AZITHROMYCIN 250 MG PO TABS: ORAL_TABLET | ORAL | 0 refills | Status: DC

## 2016-10-10 MED ORDER — ALBUTEROL SULFATE HFA 108 (90 BASE) MCG/ACT IN AERS: 1.0000 | INHALATION_SPRAY | Freq: Four times a day (QID) | RESPIRATORY_TRACT | 0 refills | Status: DC | PRN

## 2016-10-10 NOTE — ED Provider Notes (Signed)
CSN: [id number]     Arrival date & time 10/10/16  1223 History   First MD Initiated Contact with Patient 10/10/16 1346     Chief Complaint  Patient presents with  . Cough  . Nasal Congestion   (Consider location/radiation/quality/duration/timing/severity/associated sxs/prior Treatment) HPI  This a 35 year old female who presents with a history of nasal congestion cough and fever as well as exertional dyspnea. She is a smoker half a pack cigarettes per day and her O2 sat today on room air is 97%. She's had a productive cough of yellow sputum as well as some mucus from her nose. States that the shortness of breath has been sudden onset in his noticed it is becoming worse as she tries to climb stairs or walk for further distances. Denies any chest pain although has had some tightness when she coughs.       Past Medical History:  Diagnosis Date  . Bipolar 1 disorder [facility])    Past Surgical History:  Procedure Laterality Date  . CESAREAN SECTION     Family History  Problem Relation Age of Onset  . Kidney failure Father    Social History  Substance Use Topics  . Smoking status: Current Every Day Smoker    Packs/day: 0.50    Types: Cigarettes  . Smokeless tobacco: Never Used  . Alcohol use Yes   OB History    No data available     Review of Systems  Constitutional: Positive for activity change and fever. Negative for appetite change, chills and fatigue.  HENT: Positive for congestion, postnasal drip, rhinorrhea and sinus pressure.   Respiratory: Positive for cough, shortness of breath and wheezing. Negative for stridor.   All other systems reviewed and are negative.   Allergies  Review of patient's allergies indicates no known allergies.  Home Medications   Prior to Admission medications   Medication Sig Start Date End Date Taking? Authorizing Provider  albuterol (PROVENTIL HFA;VENTOLIN HFA) 108 (90 Base) MCG/ACT inhaler Inhale 1-2 puffs into the lungs every 6 (six)  hours as needed for wheezing or shortness of breath. 10/10/16   Lutricia Feil, PA-C  azithromycin (ZITHROMAX Z-PAK) 250 MG tablet Use as per package instructions 10/10/16   Lutricia Feil, PA-C   Meds Ordered and Administered this Visit   Medications  ipratropium-albuterol (DUONEB) 0.5-2.5 (3) MG/3ML nebulizer solution 3 mL (3 mLs Nebulization Given 10/10/16 1431)    BP 124/85 (BP Location: Left Arm)   Pulse 91   Temp 98.2 F (36.8 C) (Oral)   Resp 18   Ht 5\' 6"  (1.676 m)   Wt 183 lb (83 kg)   LMP 09/12/2016 (Exact Date) Comment: denies preg  SpO2 97%   BMI 29.54 kg/m  No data found.   Physical Exam  Constitutional: She is oriented to person, place, and time. She appears well-developed and well-nourished. No distress.  HENT:  Head: Normocephalic and atraumatic.  Right Ear: External ear normal.  Left Ear: External ear normal.  Nose: Nose normal.  Mouth/Throat: Oropharynx is clear and moist. No oropharyngeal exudate.  Eyes: EOM are normal. Pupils are equal, round, and reactive to light. Right eye exhibits no discharge. Left eye exhibits no discharge.  Neck: Normal range of motion. Neck supple.  Pulmonary/Chest: Effort normal. No respiratory distress. She has wheezes.  Musculoskeletal: Normal range of motion. She exhibits no edema or deformity.  Neurological: She is alert and oriented to person, place, and time.  Skin: Skin is warm and dry. She is  not diaphoretic.  Psychiatric: She has a normal mood and affect. Her behavior is normal. Judgment and thought content normal.  Nursing note and vitals reviewed.   Urgent Care Course   Clinical Course    Procedures (including critical care time)  Labs Review Labs Reviewed - No data to display  Imaging Review Dg Chest 2 View  Result Date: 10/10/2016 CLINICAL DATA:  Cough and short of breath EXAM: CHEST  2 VIEW COMPARISON:  11/30/2011 FINDINGS: The heart size and mediastinal contours are within normal limits. Both lungs  are clear. The visualized skeletal structures are unremarkable. IMPRESSION: No active cardiopulmonary disease. Electronically Signed   By: Marlan Palauharles  Clark M.D.   On: 10/10/2016 14:19     Visual Acuity Review  Right Eye Distance:   Left Eye Distance:   Bilateral Distance:    Right Eye Near:   Left Eye Near:    Bilateral Near:     Medications  ipratropium-albuterol (DUONEB) 0.5-2.5 (3) MG/3ML nebulizer solution 3 mL (3 mLs Nebulization Given 10/10/16 1431)   Patient had improved breathing after the DuoNeb treatment   MDM   1. Acute upper respiratory infection    New Prescriptions   AZITHROMYCIN (ZITHROMAX Z-PAK) 250 MG TABLET    Use as per package instructions  Plan: 1. Test/x-ray results and diagnosis reviewed with patient 2. rx as per orders; risks, benefits, potential side effects reviewed with patient 3. Recommend supportive treatment with Smoking cessation. Use of an albuterol inhaler to become short of breath. Follow-up with her primary care physician for pulmonary function testing to determine if she has a component of COPD. 4. F/u prn if symptoms worsen or don't improve     Lutricia FeilWilliam P Khalib Fendley, PA-C 10/10/16 1502

## 2016-10-10 NOTE — ED Triage Notes (Signed)
Patient started having symptoms of nasal congestion, cough, fever, and SOB 2 days ago. Symptoms have continued to worsen.

## 2018-06-06 ENCOUNTER — Other Ambulatory Visit: Payer: Self-pay

## 2018-06-06 ENCOUNTER — Ambulatory Visit
Admission: EM | Admit: 2018-06-06 | Discharge: 2018-06-06 | Disposition: A | Discharge status: Home / Self Care | Payer: Medicaid Other | Attending: Family Medicine | Admitting: Family Medicine

## 2018-06-06 ENCOUNTER — Ambulatory Visit: Payer: Medicaid Other

## 2018-06-06 DIAGNOSIS — J4521 Mild intermittent asthma with (acute) exacerbation: Secondary | ICD-10-CM

## 2018-06-06 DIAGNOSIS — F17210 Nicotine dependence, cigarettes, uncomplicated: Secondary | ICD-10-CM | POA: Insufficient documentation

## 2018-06-06 DIAGNOSIS — R0602 Shortness of breath: Secondary | ICD-10-CM | POA: Diagnosis present

## 2018-06-06 DIAGNOSIS — F319 Bipolar disorder, unspecified: Secondary | ICD-10-CM | POA: Diagnosis not present

## 2018-06-06 DIAGNOSIS — J45901 Unspecified asthma with (acute) exacerbation: Secondary | ICD-10-CM | POA: Diagnosis not present

## 2018-06-06 MED ORDER — PREDNISONE 20 MG PO TABS: ORAL_TABLET | ORAL | 0 refills | Status: AC

## 2018-06-06 MED ORDER — AZITHROMYCIN 250 MG PO TABS: ORAL_TABLET | ORAL | 0 refills | Status: AC

## 2018-06-06 MED ORDER — ALBUTEROL SULFATE HFA 108 (90 BASE) MCG/ACT IN AERS: 1.0000 | INHALATION_SPRAY | Freq: Four times a day (QID) | RESPIRATORY_TRACT | 0 refills | Status: AC | PRN

## 2018-06-06 MED ORDER — IPRATROPIUM-ALBUTEROL 0.5-2.5 (3) MG/3ML IN SOLN
3.0000 mL | Freq: Once | RESPIRATORY_TRACT | Status: AC
  Administered 2018-06-06: 3 mL via RESPIRATORY_TRACT

## 2018-06-06 NOTE — ED Triage Notes (Signed)
Patient complains of shortness of breath that started yesterday. Patient states that she has had diffculty speaking between breaths or while walking. Denies history of asthma.

## 2018-06-06 NOTE — ED Provider Notes (Signed)
[facility]    CSN: [id number] Arrival date & time: 06/06/18  1343     History   Chief Complaint Chief Complaint  Patient presents with  . Shortness of Breath    HPI Marcia Jensen is a 37 y.o. female.    Shortness of Breath  Severity:  Moderate Onset quality:  Sudden Duration:  1 day Timing:  Constant Progression:  Worsening Chronicity:  New Context: activity, smoke exposure, URI and weather changes   Context: not animal exposure, not emotional upset and not fumes   Relieved by:  None tried Ineffective treatments:  None tried Associated symptoms: wheezing   Risk factors: tobacco use     Past Medical History:  Diagnosis Date  . Bipolar 1 disorder (HCC)     There are no active problems to display for this patient.   Past Surgical History:  Procedure Laterality Date  . CESAREAN SECTION      OB History   None      Home Medications    Prior to Admission medications   Medication Sig Start Date End Date Taking? Authorizing Provider  albuterol (PROVENTIL HFA;VENTOLIN HFA) 108 (90 Base) MCG/ACT inhaler Inhale 1-2 puffs into the lungs every 6 (six) hours as needed for wheezing or shortness of breath. 06/06/18   Payton Mccallum, MD  azithromycin (ZITHROMAX Z-PAK) 250 MG tablet 2 tabs po once day 1, then 1 tab po qd for the next 4 days 06/06/18   Payton Mccallum, MD  predniSONE (DELTASONE) 20 MG tablet 3 tabs po once day 1, then 2 tabs po qd x 2 days, then 1 tab po qd x 2 days, then half a tab po qd x 2 days 06/06/18   Payton Mccallum, MD    Family History Family History  Problem Relation Age of Onset  . Kidney failure Father     Social History Social History   Tobacco Use  . Smoking status: Current Every Day Smoker    Packs/day: 0.50    Types: Cigarettes  . Smokeless tobacco: Never Used  Substance Use Topics  . Alcohol use: Yes  . Drug use: No     Allergies   Patient has no known allergies.   Review of Systems Review of Systems    Respiratory: Positive for shortness of breath and wheezing.      Physical Exam Triage Vital Signs ED Triage Vitals  Enc Vitals Group     BP 06/06/18 1357 136/77     Pulse Rate 06/06/18 1352 94     Resp 06/06/18 1357 (!) 25     Temp 06/06/18 1357 98.9 F (37.2 C)     Temp Source 06/06/18 1357 Oral     SpO2 06/06/18 1352 93 %     Weight 06/06/18 1358 200 lb (90.7 kg)     Height 06/06/18 1358 5\' 6"  (1.676 m)     Head Circumference --      Peak Flow --      Pain Score --      Pain Loc --      Pain Edu? --      Excl. in [place]? --    No data found.  Updated Vital Signs BP 136/77 (BP Location: Left Arm)   Pulse (!) 106   Temp 98.9 F (37.2 C) (Oral)   Resp (!) 25   Ht 5\' 6"  (1.676 m)   Wt 200 lb (90.7 kg)   LMP 06/04/2018 Comment: denies preg  SpO2 94%  BMI 32.28 kg/m   Visual Acuity Right Eye Distance:   Left Eye Distance:   Bilateral Distance:    Right Eye Near:   Left Eye Near:    Bilateral Near:     Physical Exam  Constitutional: She appears well-developed and well-nourished. No distress.  HENT:  Head: Normocephalic and atraumatic.  Nose: No mucosal edema, rhinorrhea, nose lacerations, sinus tenderness, nasal deformity, septal deviation or nasal septal hematoma. No epistaxis.  No foreign bodies. Right sinus exhibits no maxillary sinus tenderness and no frontal sinus tenderness. Left sinus exhibits no maxillary sinus tenderness and no frontal sinus tenderness.  Mouth/Throat: Uvula is midline, oropharynx is clear and moist and mucous membranes are normal. No oropharyngeal exudate.  Eyes: Conjunctivae are normal. Right eye exhibits no discharge. Left eye exhibits no discharge. No scleral icterus.  Neck: Normal range of motion. Neck supple. No tracheal deviation present. No thyromegaly present.  Cardiovascular: Normal rate, regular rhythm and normal heart sounds.  Pulmonary/Chest: Effort normal. No stridor. No respiratory distress. She has wheezes (diffuse wheezes,  rhonchi and decreased breath sounds at the bases). She has no rales.  Lymphadenopathy:    She has no cervical adenopathy.  Skin: She is not diaphoretic.  Nursing note and vitals reviewed.    UC Treatments / Results  Labs (all labs ordered are listed, but only abnormal results are displayed) Labs Reviewed - No data to display  EKG None  Radiology Dg Chest 2 View  Result Date: 06/06/2018 CLINICAL DATA:  Shortness of breath and chest tightness, productive cough for 2 days. History of pneumonia and bronchitis. EXAM: CHEST - 2 VIEW COMPARISON:  Chest radiograph October 10, 2016 FINDINGS: Cardiomediastinal silhouette is normal. No pleural effusions or focal consolidations. Mild bronchitic changes and hyperinflation. Trachea projects midline and there is no pneumothorax. Soft tissue planes and included osseous structures are non-suspicious. IMPRESSION: Bronchitic changes with hyperinflation associated with reactive airway disease. No focal consolidation. Electronically Signed   By: Awilda Metroourtnay  Bloomer M.D.   On: 06/06/2018 14:40    Procedures Procedures (including critical care time)  Medications Ordered in UC Medications  ipratropium-albuterol (DUONEB) 0.5-2.5 (3) MG/3ML nebulizer solution 3 mL (3 mLs Nebulization Given 06/06/18 1413)  ipratropium-albuterol (DUONEB) 0.5-2.5 (3) MG/3ML nebulizer solution 3 mL (3 mLs Nebulization Given 06/06/18 1453)    Initial Impression / Assessment and Plan / UC Course  I have reviewed the triage vital signs and the nursing notes.  Pertinent labs & imaging results that were available during my care of the patient were reviewed by me and considered in my medical decision making (see chart for details).     Final Clinical Impressions(s) / UC Diagnoses   Final diagnoses:  Mild intermittent reactive airway disease with acute exacerbation    ED Prescriptions    Medication Sig Dispense Auth. Provider   azithromycin (ZITHROMAX Z-PAK) 250 MG tablet 2  tabs po once day 1, then 1 tab po qd for the next 4 days 6 each Payton Mccallumonty, Daejah Klebba, MD   predniSONE (DELTASONE) 20 MG tablet 3 tabs po once day 1, then 2 tabs po qd x 2 days, then 1 tab po qd x 2 days, then half a tab po qd x 2 days 10 tablet Payton Mccallumonty, Deiona Hooper, MD   albuterol (PROVENTIL HFA;VENTOLIN HFA) 108 (90 Base) MCG/ACT inhaler Inhale 1-2 puffs into the lungs every 6 (six) hours as needed for wheezing or shortness of breath. 1 Inhaler Payton Mccallumonty, Nurah Petrides, MD     1. x-ray results and diagnosis reviewed  with patient; duoneb tx x 2 given with improvement of symptoms 2. rx as per orders above; reviewed possible side effects, interactions, risks and benefits  3. Recommend supportive treatment with increase fluids 4. Recommend smoking cessation 5. Follow-up prn if symptoms worsen or don't improve  Controlled Substance Prescriptions [place] Controlled Substance Registry consulted? Not Applicable   Payton Mccallum, MD 06/06/18 [phone]

## 2019-06-12 ENCOUNTER — Encounter: Payer: Self-pay | Admitting: Emergency Medicine

## 2019-06-12 ENCOUNTER — Other Ambulatory Visit: Payer: Self-pay

## 2019-06-12 DIAGNOSIS — H9202 Otalgia, left ear: Secondary | ICD-10-CM | POA: Diagnosis present

## 2019-06-12 DIAGNOSIS — F17210 Nicotine dependence, cigarettes, uncomplicated: Secondary | ICD-10-CM | POA: Insufficient documentation

## 2019-06-12 DIAGNOSIS — K0889 Other specified disorders of teeth and supporting structures: Secondary | ICD-10-CM | POA: Insufficient documentation

## 2019-06-12 DIAGNOSIS — H6693 Otitis media, unspecified, bilateral: Secondary | ICD-10-CM | POA: Insufficient documentation

## 2019-06-12 NOTE — ED Triage Notes (Signed)
Patient ambulatory to triage with steady gait, without difficulty or distress noted; pt reports left earache, jaw pain and dental pain x wk

## 2019-06-13 ENCOUNTER — Emergency Department
Admission: EM | Admit: 2019-06-13 | Discharge: 2019-06-13 | Disposition: A | Discharge status: Home / Self Care | Payer: Medicaid Other | Attending: Emergency Medicine | Admitting: Emergency Medicine

## 2019-06-13 DIAGNOSIS — K0889 Other specified disorders of teeth and supporting structures: Secondary | ICD-10-CM

## 2019-06-13 DIAGNOSIS — H6693 Otitis media, unspecified, bilateral: Secondary | ICD-10-CM

## 2019-06-13 MED ORDER — TRAMADOL HCL 50 MG PO TABS: 50.0000 mg | ORAL_TABLET | Freq: Four times a day (QID) | ORAL | 0 refills | Status: AC | PRN

## 2019-06-13 MED ORDER — KETOROLAC TROMETHAMINE 10 MG PO TABS
10.0000 mg | ORAL_TABLET | Freq: Once | ORAL | Status: AC
  Administered 2019-06-13: 10 mg via ORAL
  Filled 2019-06-13: qty 1

## 2019-06-13 MED ORDER — AMOXICILLIN-POT CLAVULANATE 875-125 MG PO TABS: 1.0000 | ORAL_TABLET | Freq: Two times a day (BID) | ORAL | 0 refills | Status: AC

## 2019-06-13 MED ORDER — AMOXICILLIN-POT CLAVULANATE 875-125 MG PO TABS
1.0000 | ORAL_TABLET | Freq: Once | ORAL | Status: AC
  Administered 2019-06-13: 1 via ORAL
  Filled 2019-06-13: qty 1

## 2019-06-13 NOTE — ED Notes (Signed)
Pateint AAOx4. Vitals Stable. NAD. 

## 2019-06-13 NOTE — ED Notes (Addendum)
Patient reports to ED with dental pain, ear pain, nose pain . patient is currently taking equate for the pain. Sated pain started about 4 days ago.

## 2019-06-13 NOTE — ED Provider Notes (Signed)
[facility] Emergency Department Provider Note   First MD Initiated Contact with Patient 06/13/19 0100     (approximate)  I have reviewed the triage vital signs and the nursing notes.   HISTORY  Chief Complaint Dental Pain    HPI Marcia Jensen is a 38 y.o. female history of bipolar disorder presents to the emergency department secondary to 1 week history of left earache/left jaw and dental pain.  Patient denies any fever afebrile on presentation.       Past Medical History:  Diagnosis Date  . Bipolar 1 disorder ([place])     There are no active problems to display for this patient.   Past Surgical History:  Procedure Laterality Date  . CESAREAN SECTION      Prior to Admission medications   Medication Sig Start Date End Date Taking? Authorizing Provider  albuterol (PROVENTIL HFA;VENTOLIN HFA) 108 (90 Base) MCG/ACT inhaler Inhale 1-2 puffs into the lungs every 6 (six) hours as needed for wheezing or shortness of breath. 06/06/18   Norval Gable, MD  azithromycin (ZITHROMAX Z-PAK) 250 MG tablet 2 tabs po once day 1, then 1 tab po qd for the next 4 days 06/06/18   Norval Gable, MD  predniSONE (DELTASONE) 20 MG tablet 3 tabs po once day 1, then 2 tabs po qd x 2 days, then 1 tab po qd x 2 days, then half a tab po qd x 2 days 06/06/18   Norval Gable, MD    Allergies Patient has no known allergies.  Family History  Problem Relation Age of Onset  . Kidney failure Father     Social History Social History   Tobacco Use  . Smoking status: Current Every Day Smoker    Packs/day: 0.50    Types: Cigarettes  . Smokeless tobacco: Never Used  Substance Use Topics  . Alcohol use: Yes  . Drug use: No    Review of Systems Constitutional: No fever/chills Eyes: No visual changes. ENT: No sore throat.  Positive for left earache and dental pain. Cardiovascular: Denies chest pain. Respiratory: Denies shortness of breath. Gastrointestinal: No  abdominal pain.  No nausea, no vomiting.  No diarrhea.  No constipation. Genitourinary: Negative for dysuria. Musculoskeletal: Negative for neck pain.  Negative for back pain. Integumentary: Negative for rash. Neurological: Negative for headaches, focal weakness or numbness.   ____________________________________________   PHYSICAL EXAM:  VITAL SIGNS: ED Triage Vitals  Enc Vitals Group     BP 06/12/19 2107 124/76     Pulse Rate 06/12/19 2107 83     Resp 06/12/19 2107 18     Temp 06/12/19 2107 98.6 F (37 C)     Temp Source 06/12/19 2107 Oral     SpO2 06/12/19 2107 99 %     Weight 06/12/19 2108 90.7 kg (200 lb)     Height --      Head Circumference --      Peak Flow --      Pain Score 06/12/19 2107 10     Pain Loc --      Pain Edu? --      Excl. in [place]? --     Constitutional: Alert and oriented. Well appearing and in no acute distress. Eyes: Conjunctivae are normal.  Head: Atraumatic. Ears:  Healthy appearing ear canals and bulging bilateral TM worse on the left with opacification. Nose: No congestion/rhinnorhea. Mouth/Throat: Mucous membranes are moist.  Oropharynx non-erythematous.  Multiple dental caries noted. Neck: No stridor.  Cardiovascular: Normal rate, regular rhythm. Good peripheral circulation. Grossly normal heart sounds. Respiratory: Normal respiratory effort.  No retractions. No audible wheezing. Gastrointestinal: Soft and nontender. No distention.  Musculoskeletal: No lower extremity tenderness nor edema. No gross deformities of extremities. Neurologic:  Normal speech and language. No gross focal neurologic deficits are appreciated.  Skin:  Skin is warm, dry and intact. No rash noted. Psychiatric: Mood and affect are normal. Speech and behavior are normal.     Procedures   ____________________________________________   INITIAL IMPRESSION / MDM / ASSESSMENT AND PLAN / ED COURSE  As part of my medical decision making, I reviewed the following data  within the electronic MEDICAL RECORD NUMBER   38 year old female presented with history and physical exam consistent with acute otitis media and dental caries.  Patient given Augmentin and tramadol.  *Marcia Jensen was evaluated in Emergency Department on 06/13/2019 for the symptoms described in the history of present illness. She was evaluated in the context of the global COVID-19 pandemic, which necessitated consideration that the patient might be at risk for infection with the SARS-CoV-2 virus that causes COVID-19. Institutional protocols and algorithms that pertain to the evaluation of patients at risk for COVID-19 are in a state of rapid change based on information released by regulatory bodies including the CDC and federal and state organizations. These policies and algorithms were followed during the patient's care in the ED.  Some ED evaluations and interventions may be delayed as a result of limited staffing during the pandemic.*       ____________________________________________  FINAL CLINICAL IMPRESSION(S) / ED DIAGNOSES  Final diagnoses:  Pain, dental  Bilateral otitis media, unspecified otitis media type     MEDICATIONS GIVEN DURING THIS VISIT:  Medications - No data to display   ED Discharge Orders    None       Note:  This document was prepared using Dragon voice recognition software and may include unintentional dictation errors.   Darci CurrentBrown, [place] N, MD 06/13/19 0111

## 2019-12-15 IMAGING — CR DG CHEST 2V
2 series · 2 of 2 positions shown · non-contrast
Comparison: Chest radiograph October 10, 2016

CLINICAL DATA: Shortness of breath and chest tightness, productive
cough for 2 days. History of pneumonia and bronchitis.

EXAM:
CHEST - 2 VIEW

[chest pa]
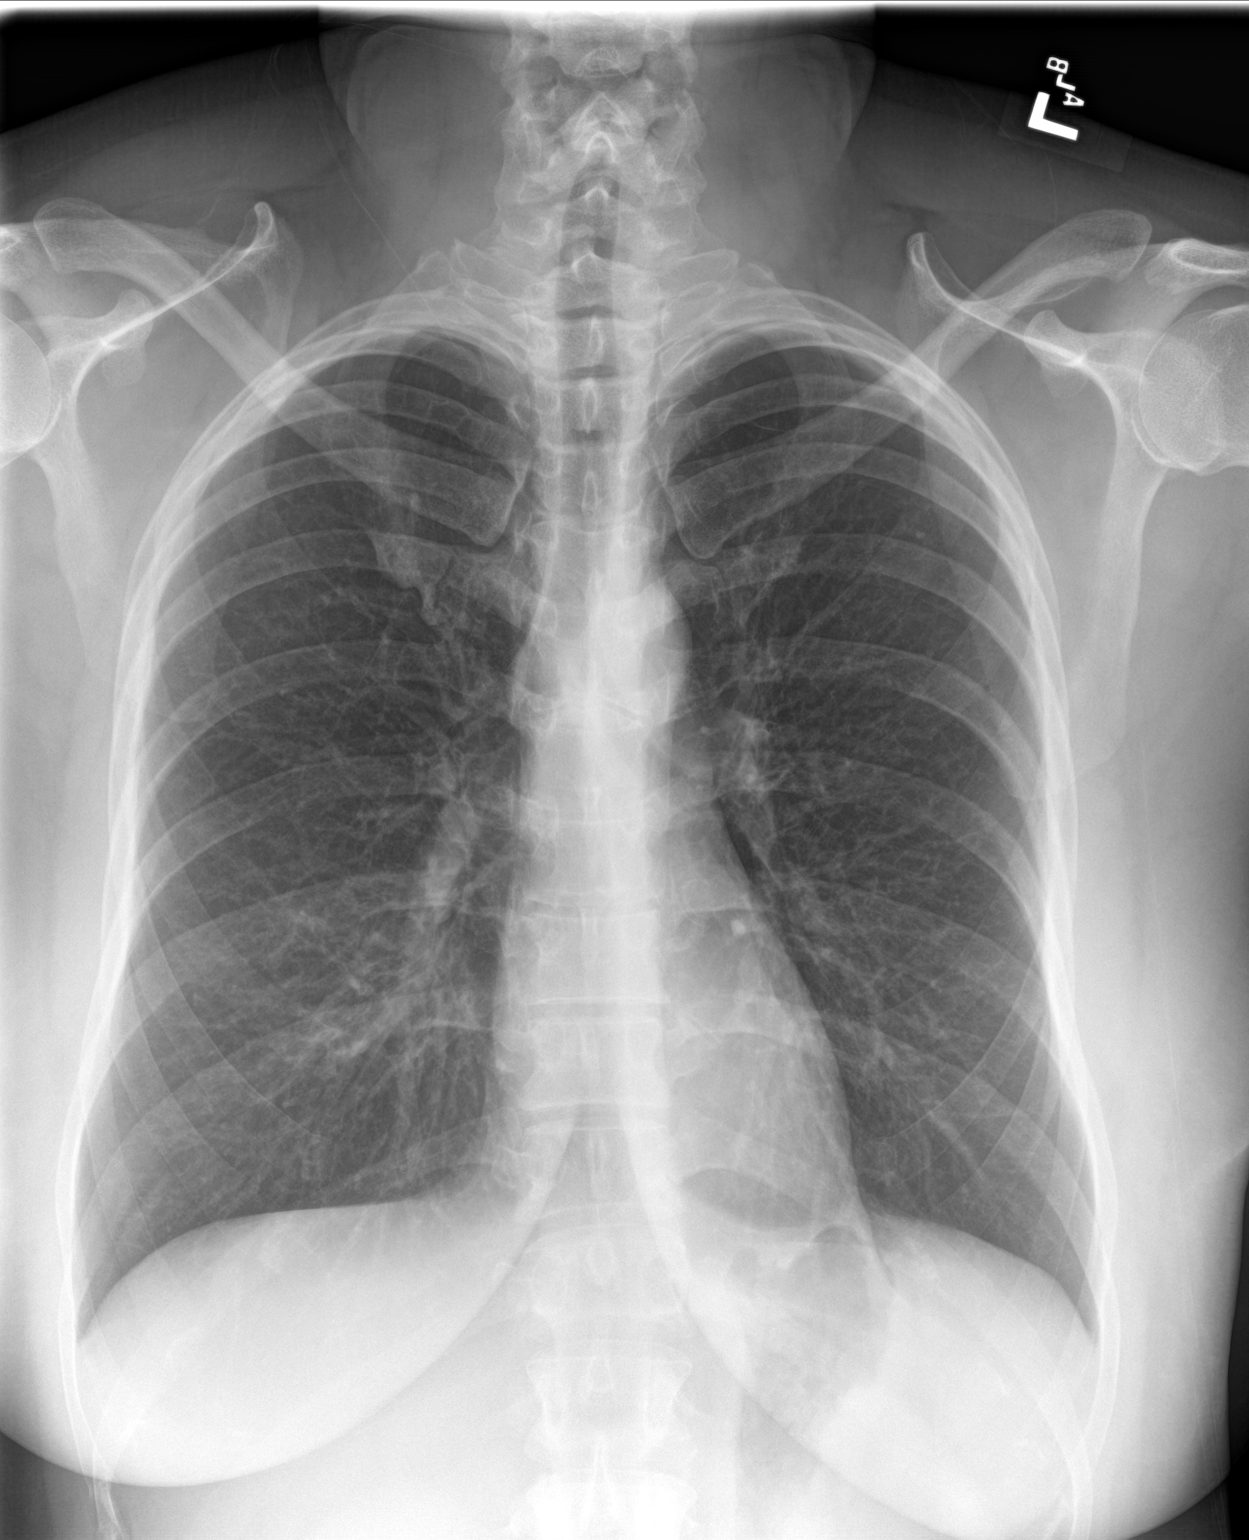

[chest lat]
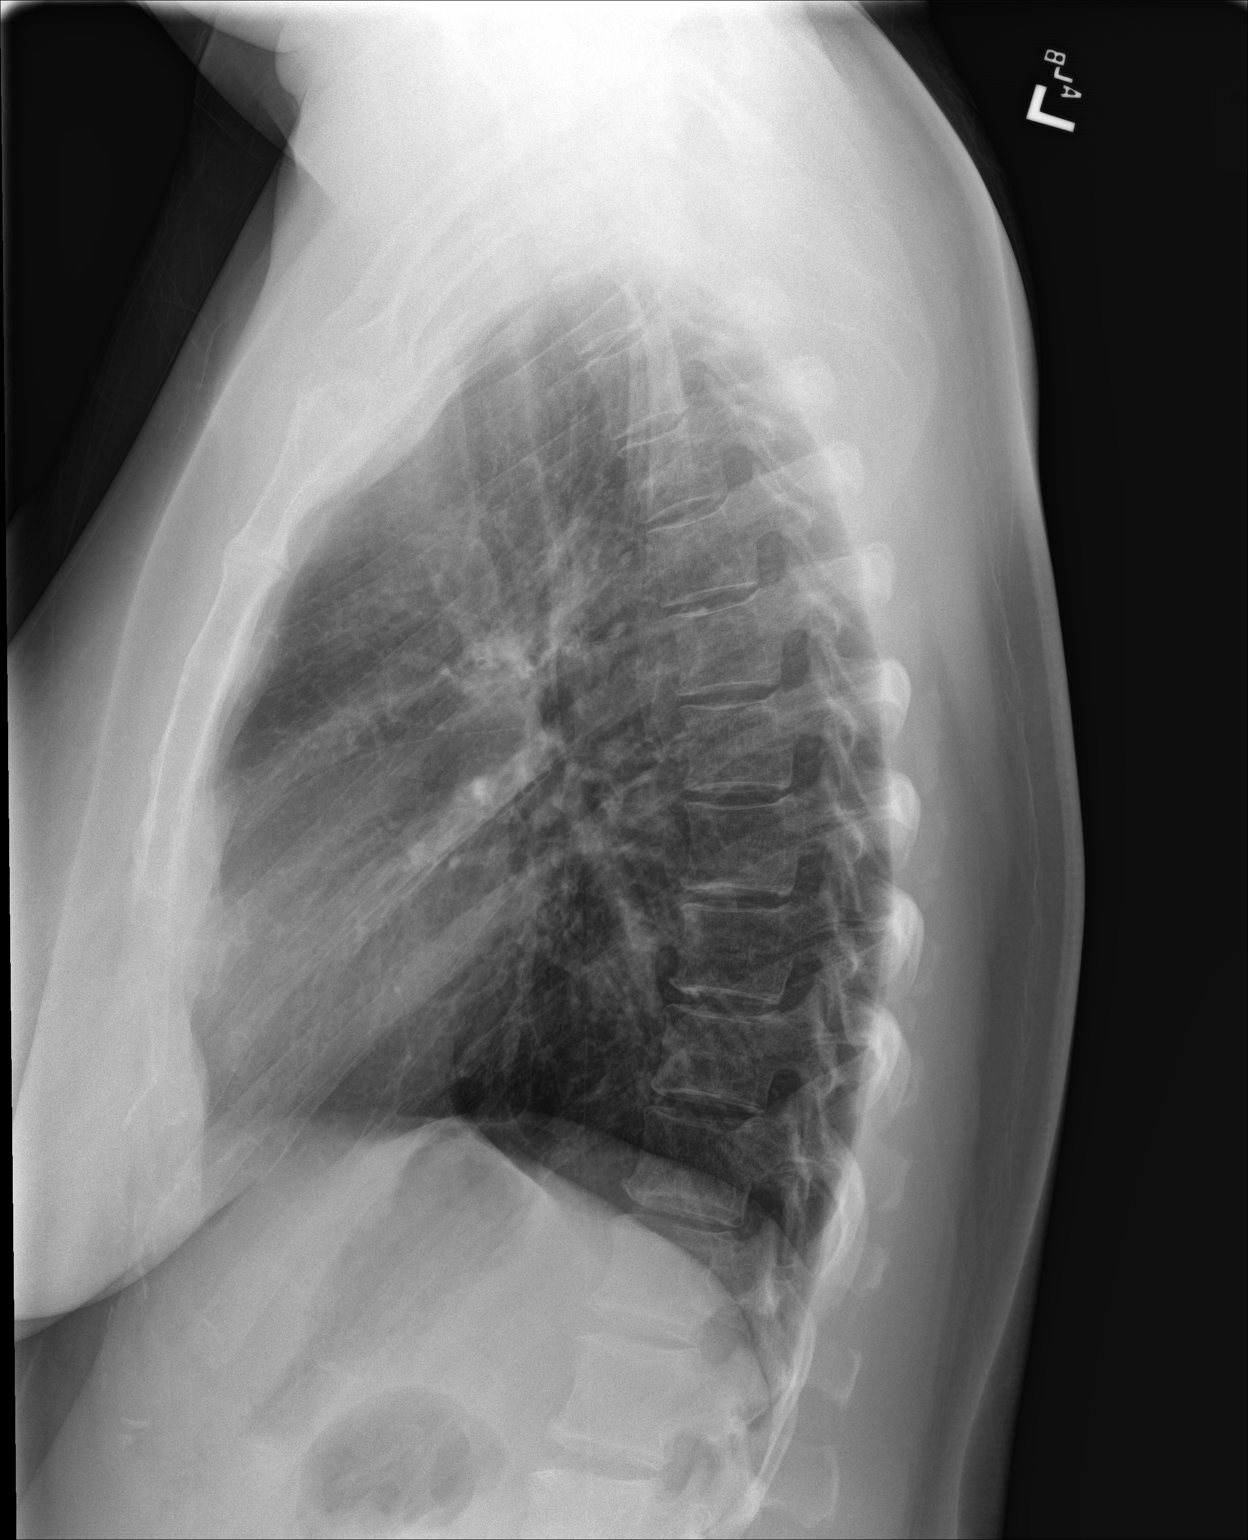

[2 of 2 positions shown; findings below may reference images not displayed]

FINDINGS: Cardiomediastinal silhouette is normal. No pleural effusions or
focal consolidations. Mild bronchitic changes and hyperinflation.
Trachea projects midline and there is no pneumothorax. Soft tissue
planes and included osseous structures are non-suspicious.
IMPRESSION: Bronchitic changes with hyperinflation associated with reactive
airway disease. No focal consolidation.

## 2022-10-20 ENCOUNTER — Ambulatory Visit: Payer: Medicaid Other | Admitting: Dermatology

## 2024-03-28 ENCOUNTER — Encounter: Payer: Self-pay | Admitting: Emergency Medicine

## 2024-03-28 ENCOUNTER — Emergency Department
Admission: EM | Admit: 2024-03-28 | Discharge: 2024-03-28 | Disposition: A | Discharge status: Home / Self Care | Attending: Emergency Medicine | Admitting: Emergency Medicine

## 2024-03-28 DIAGNOSIS — N764 Abscess of vulva: Secondary | ICD-10-CM | POA: Diagnosis present

## 2024-03-28 DIAGNOSIS — L0291 Cutaneous abscess, unspecified: Secondary | ICD-10-CM

## 2024-03-28 MED ORDER — LIDOCAINE HCL (PF) 1 % IJ SOLN
5.0000 mL | Freq: Once | INTRAMUSCULAR | Status: AC
  Administered 2024-03-28: 5 mL
  Filled 2024-03-28: qty 5

## 2024-03-28 MED ORDER — CEPHALEXIN 500 MG PO CAPS: 500.0000 mg | ORAL_CAPSULE | Freq: Four times a day (QID) | ORAL | 0 refills | Status: AC

## 2024-03-28 NOTE — Discharge Instructions (Addendum)
 You were evaluated in the ED for an abscess.  We were able to successfully drain the abscess with an incision and drainage procedure.  Please keep the area clean with soap and water.  You are encouraged to perform sitz bath's once a day for a week.  Apply warm compresses over the area.  Monitor for signs of infection as discussed.  Follow-up with your PCP.  Please take antibiotics as prescribed until full dosage is complete.

## 2024-03-28 NOTE — ED Triage Notes (Signed)
 Pt presents with multiple complaints. Initial complaint is swelling in pelvis that is painful, reports that she thought it was a boil so she put a heating pad on it but that did not help. Pt reports she also has bumps to bilateral arms that are not visible at this time.   Also has a cold.

## 2024-03-28 NOTE — ED Provider Notes (Signed)
 [facility] Emergency Department Provider Note     Event Date/Time   First MD Initiated Contact with Patient 03/28/24 2010     (approximate)   History   No chief complaint on file.   HPI  Marcia Jensen is a 43 y.o. female with a history of bipolar disorder presents to the ED for evaluation of an abscess to the top of her vagina  noticed 2days ago. She tried to pop it with no success. Pt reports has had this happen in the past about a year ago. No fevers or chills.       Physical Exam   Triage Vital Signs: ED Triage Vitals [03/28/24 1655]  Encounter Vitals Group     BP 127/81     Systolic BP Percentile      Diastolic BP Percentile      Pulse Rate 87     Resp 18     Temp 98.1 F (36.7 C)     Temp Source Oral     SpO2 100 %     Weight      Height      Head Circumference      Peak Flow      Pain Score 6     Pain Loc      Pain Education      Exclude from Growth Chart     Most recent vital signs: Vitals:   03/28/24 1655 03/28/24 2054  BP: 127/81 109/77  Pulse: 87 78  Resp: 18 18  Temp: 98.1 F (36.7 C)   SpO2: 100% 100%    General Awake, no distress.  HEENT NCAT.  CV:  Good peripheral perfusion.  RESP:  Normal effort.  ABD:  No distention.  Other:  2 cm abscess to the left mons pubis area with a central fluctuant head.  Surrounding erythema.   ED Results / Procedures / Treatments   Labs (all labs ordered are listed, but only abnormal results are displayed) Labs Reviewed - No data to display   No results found.  PROCEDURES:  Critical Care performed: No  .Incision and Drainage  Date/Time: 03/29/2024 12:17 AM  Performed by: Conrad Albemarle, PA-C Authorized by: Conrad Pulaski, PA-C   Consent:    Consent obtained:  Verbal   Consent given by:  Patient   Risks discussed:  Bleeding, incomplete drainage, pain and infection Location:    Type:  Abscess   Size:  2 cm   Location:  Anogenital   Anogenital  location:  [place] Pre-procedure details:    Skin preparation:  Povidone-iodine Sedation:    Sedation type:  None Anesthesia:    Anesthesia method:  Local infiltration   Local anesthetic:  Lidocaine 1% w/o epi Procedure type:    Complexity:  Simple Procedure details:    Incision types:  Single straight   Incision depth:  Dermal   Wound management:  Probed and deloculated and irrigated with saline   Drainage:  Purulent   Drainage amount:  Moderate   Wound treatment:  Wound left open (dressing placed with guaze)   Packing materials:  None Post-procedure details:    Procedure completion:  Tolerated    MEDICATIONS ORDERED IN ED: Medications  lidocaine (PF) (XYLOCAINE) 1 % injection 5 mL (5 mLs Infiltration Given by Other 03/28/24 2125)     IMPRESSION / MDM / ASSESSMENT AND PLAN / ED COURSE  I reviewed the triage vital signs and the nursing notes.  43 y.o. female presents to the emergency department for evaluation and treatment of abscess. See HPI for further details.   Differential diagnosis includes, but is not limited to abscess, Bartholin cyst, cellulitis  Patient's presentation is most consistent with acute complicated illness / injury requiring diagnostic workup.  Patient is alert and oriented.  She is hemodynamically stable.  Please see procedure note for incision and drainage which was performed successfully.  Encourage warm compress and sitz bath.  Advised patient to follow-up with PCP.  Keflex prescribed. The patient is in stable and satisfactory condition for discharge home. ED precautions discussed. All questions and concerns were addressed during this ED visit.     FINAL CLINICAL IMPRESSION(S) / ED DIAGNOSES   Final diagnoses:  Abscess     Rx / DC Orders   ED Discharge Orders          Ordered    cephALEXin (KEFLEX) 500 MG capsule  4 times daily        03/28/24 2220             Note:  This document was prepared using  Dragon voice recognition software and may include unintentional dictation errors.    Romeo Apple, Arneisha Kincannon A, PA-C 03/29/24 0019    Claybon Jabs, MD 03/29/24 [phone]
